# Patient Record
Sex: Female | Born: 1995
Health system: Southern US, Community
[De-identification: ages and names within clinical notes are randomized; demographics above are authoritative.]

## PROBLEM LIST (undated history)

## (undated) ENCOUNTER — Inpatient Hospital Stay: Payer: Self-pay

## (undated) DIAGNOSIS — S5330XA Traumatic rupture of unspecified ulnar collateral ligament, initial encounter: Secondary | ICD-10-CM

## (undated) HISTORY — PX: NO PAST SURGERIES: SHX2092

## (undated) HISTORY — DX: Traumatic rupture of unspecified ulnar collateral ligament, initial encounter: S53.30XA

## (undated) HISTORY — PX: EAR TUBE REMOVAL: SHX1486

## (undated) HISTORY — PX: WISDOM TOOTH EXTRACTION: SHX21

---
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
## 2017-01-17 DIAGNOSIS — Z0000 Encounter for general adult medical examination without abnormal findings: Secondary | ICD-10-CM | POA: Diagnosis not present

## 2017-01-30 ENCOUNTER — Encounter: Payer: Self-pay | Admitting: Obstetrics and Gynecology

## 2017-01-30 ENCOUNTER — Ambulatory Visit (INDEPENDENT_AMBULATORY_CARE_PROVIDER_SITE_OTHER): Payer: 59 | Admitting: Obstetrics and Gynecology

## 2017-01-30 VITALS — BP 120/70 | HR 99 | Ht 68.0 in | Wt 138.0 lb

## 2017-01-30 DIAGNOSIS — Z113 Encounter for screening for infections with a predominantly sexual mode of transmission: Secondary | ICD-10-CM | POA: Diagnosis not present

## 2017-01-30 DIAGNOSIS — Z30011 Encounter for initial prescription of contraceptive pills: Secondary | ICD-10-CM

## 2017-01-30 MED ORDER — NORETHIN-ETH ESTRAD-FE BIPHAS 1 MG-10 MCG / 10 MCG PO TABS: 1.0000 | ORAL_TABLET | Freq: Every day | ORAL | 3 refills | Status: DC

## 2017-01-30 NOTE — Progress Notes (Signed)
   Chief Complaint  Patient presents with  . Contraception    HPI:      [name] is a 21 y.o. G0P0000 who LMP was Patient's last menstrual period was 01/17/2017., presents today for [facility] consultation. Pt interested in OCPs. No hx of migraines with aura/HTN/tobacco use/clotting disorders. She has been sex active in the past, used condoms. No current sex activity.   No tob/alcohol/drug use.  Occas exercise.  No FH breast, ovar, colon cancer.    History reviewed. No pertinent past medical history.  Past Surgical History:  Procedure Laterality Date  . NO PAST SURGERIES      Family History  Problem Relation Age of Onset  . Cancer Mother 27       MELANOMA OF SKIN    Social History   Social History  . Marital status: Single    Spouse name: N/A  . Number of children: 0  . Years of education: 12   Occupational History  . STUDENT    Social History Main Topics  . Smoking status: Never Smoker  . Smokeless tobacco: Never Used  . Alcohol use No  . Drug use: No  . Sexual activity: No   Other Topics Concern  . Not on file   Social History Narrative  . No narrative on file     Current Outpatient Prescriptions:  .  Norethindrone-Ethinyl Estradiol-Fe Biphas (LO LOESTRIN FE) 1 MG-10 MCG / 10 MCG tablet, Take 1 tablet by mouth daily., Disp: 84 tablet, Rfl: 3   ROS:  Review of Systems  Constitutional: Negative for fever.  Gastrointestinal: Negative for blood in stool, constipation, diarrhea, nausea and vomiting.  Genitourinary: Negative for dyspareunia, dysuria, flank pain, frequency, hematuria, urgency, vaginal bleeding, vaginal discharge and vaginal pain.  Musculoskeletal: Negative for back pain.  Skin: Negative for rash.     OBJECTIVE:   Vitals:  BP 120/70   Pulse 99   Ht 5\' 8"  (1.727 m)   Wt 138 lb (62.6 kg)   LMP 01/17/2017   BMI 20.98 kg/m   Physical Exam  Constitutional: She is oriented to person, place, and time and well-developed,  well-nourished, and in no distress. Vital signs are normal.  Genitourinary: Vagina normal, uterus normal, cervix normal, right adnexa normal, left adnexa normal and vulva normal. Uterus is not enlarged. Cervix exhibits no motion tenderness and no tenderness. Right adnexum displays no mass and no tenderness. Left adnexum displays no mass and no tenderness. Vulva exhibits no erythema, no exudate, no lesion, no rash and no tenderness. Vagina exhibits no lesion.  Neurological: She is oriented to person, place, and time.  Vitals reviewed.    Assessment/Plan: Encounter for initial prescription of contraceptive pills - OCP Rx eRxd. 1 sample/coupon card given to pt. - Plan: Norethindrone-Ethinyl Estradiol-Fe Biphas (LO LOESTRIN FE) 1 MG-10 MCG / 10 MCG tablet  Screening for STD (sexually transmitted disease) - Plan: Chlamydia/Gonococcus/Trichomonas, NAA    Meds ordered this encounter  Medications  . Norethindrone-Ethinyl Estradiol-Fe Biphas (LO LOESTRIN FE) 1 MG-10 MCG / 10 MCG tablet    Sig: Take 1 tablet by mouth daily.    Dispense:  84 tablet    Refill:  3      Return in about 1 year (around 01/30/2018).  Carlos Quackenbush B. Jewell Haught, PA-C 01/30/2017 3:15 PM

## 2017-02-01 LAB — CHLAMYDIA/GONOCOCCUS/TRICHOMONAS, NAA
Chlamydia by NAA: NEGATIVE
Gonococcus by NAA: NEGATIVE
Trich vag by NAA: NEGATIVE

## 2017-06-03 ENCOUNTER — Telehealth: Payer: Self-pay

## 2017-06-03 NOTE — Telephone Encounter (Signed)
Pt called stating she never filled bc rx d/t it being $100/m.  Do we recommend a brand that is cheaper?  [phone]  Adv pt to call her ins co and get top three names of bcp they will cover and call us back.

## 2017-08-19 ENCOUNTER — Telehealth: Payer: Self-pay

## 2017-08-19 NOTE — Telephone Encounter (Signed)
Pt mom Nira Conn calling to report pt saw [facility] in August & was rx'd Lo Loestrin which was several hundred dollars/mo. She is inquiring if there is another OCP that would be cheaper. UV#[phone].

## 2017-12-01 ENCOUNTER — Emergency Department
Admission: EM | Admit: 2017-12-01 | Discharge: 2017-12-01 | Disposition: A | Discharge status: Home / Self Care | Payer: 59 | Attending: Emergency Medicine | Admitting: Emergency Medicine

## 2017-12-01 ENCOUNTER — Emergency Department: Payer: 59

## 2017-12-01 DIAGNOSIS — S59909A Unspecified injury of unspecified elbow, initial encounter: Secondary | ICD-10-CM

## 2017-12-01 DIAGNOSIS — Y9316 Activity, rowing, canoeing, kayaking, rafting and tubing: Secondary | ICD-10-CM | POA: Insufficient documentation

## 2017-12-01 DIAGNOSIS — Y9289 Other specified places as the place of occurrence of the external cause: Secondary | ICD-10-CM | POA: Diagnosis not present

## 2017-12-01 DIAGNOSIS — Y998 Other external cause status: Secondary | ICD-10-CM | POA: Diagnosis not present

## 2017-12-01 DIAGNOSIS — S59901A Unspecified injury of right elbow, initial encounter: Secondary | ICD-10-CM | POA: Insufficient documentation

## 2017-12-01 DIAGNOSIS — R600 Localized edema: Secondary | ICD-10-CM | POA: Diagnosis not present

## 2017-12-01 DIAGNOSIS — Z79899 Other long term (current) drug therapy: Secondary | ICD-10-CM | POA: Insufficient documentation

## 2017-12-01 NOTE — ED Provider Notes (Signed)
[facility] Emergency Department Provider Note  ____________________________________________   First MD Initiated Contact with Patient 12/01/17 2049     (approximate)  I have reviewed the triage vital signs and the nursing notes.   HISTORY  Chief Complaint Elbow Injury    HPI Roberta Gonzales is a 22 y.o. female was tubing behind a boat and fell off the tube.  She felt a large pop in the right elbow upon impact.  The elbow has become very swollen since impact.  She cannot extend the elbow due to pain.  She denies any numbness or tingling.  She denies any other injuries at this time.  History reviewed. No pertinent past medical history.  There are no active problems to display for this patient.   Past Surgical History:  Procedure Laterality Date  . EAR TUBE REMOVAL    . NO PAST SURGERIES    . WISDOM TOOTH EXTRACTION      Prior to Admission medications   Medication Sig Start Date End Date Taking? Authorizing Provider  Norethindrone-Ethinyl Estradiol-Fe Biphas (LO LOESTRIN FE) 1 MG-10 MCG / 10 MCG tablet Take 1 tablet by mouth daily. 0/62/69   Copland, Deirdre Evener, PA-C    Allergies Patient has no known allergies.  Family History  Problem Relation Age of Onset  . Cancer Mother 52       MELANOMA OF SKIN    Social History Social History   Tobacco Use  . Smoking status: Never Smoker  . Smokeless tobacco: Never Used  Substance Use Topics  . Alcohol use: No  . Drug use: No    Review of Systems  Constitutional: No fever/chills Eyes: No visual changes. ENT: No sore throat. Respiratory: Denies cough Genitourinary: Negative for dysuria. Musculoskeletal: Negative for back pain.  Positive for right elbow pain and swelling Skin: Negative for rash.    ____________________________________________   PHYSICAL EXAM:  VITAL SIGNS: ED Triage Vitals  Enc Vitals Group     BP 12/01/17 2031 120/65     Pulse Rate 12/01/17 2031 82     Resp  12/01/17 2031 18     Temp 12/01/17 2031 98.4 F (36.9 C)     Temp Source 12/01/17 2031 Oral     SpO2 12/01/17 2031 100 %     Weight 12/01/17 2031 130 lb (59 kg)     Height 12/01/17 2031 5\' 9"  (1.753 m)     Head Circumference --      Peak Flow --      Pain Score 12/01/17 2034 8     Pain Loc --      Pain Edu? --      Excl. in [place]? --     Constitutional: Alert and oriented. Well appearing and in no acute distress. Eyes: Conjunctivae are normal.  Head: Atraumatic. Nose: No congestion/rhinnorhea. Mouth/Throat: Mucous membranes are moist.   Cardiovascular: Normal rate, regular rhythm. Respiratory: Normal respiratory effort.  No retractions GU: deferred Musculoskeletal: Decreased range of motion of the right elbow.  Patient is unable to fully extend the elbow.  The elbow is very bruised and swollen.  The area is very tender to palpation.  Patient is able to grip my fingers with the right hand.  She is neurovascularly intact Neurologic:  Normal speech and language.  Skin:  Skin is warm, dry and intact. No rash noted. Psychiatric: Mood and affect are normal. Speech and behavior are normal.  ____________________________________________   LABS (all labs ordered are listed, but  only abnormal results are displayed)  Labs Reviewed - No data to display ____________________________________________   ____________________________________________  RADIOLOGY  X-ray of the right elbow is negative for any acute fracture, however there is a large amount of soft tissue swelling noted which could mask an occult fracture  ____________________________________________   PROCEDURES  Procedure(s) performed: Sling was applied by nursing staff  Procedures    ____________________________________________   INITIAL IMPRESSION / ASSESSMENT AND PLAN / ED COURSE  Pertinent labs & imaging results that were available during my care of the patient were reviewed by me and considered in my medical  decision making (see chart for details).  Patient is a 22 year old female presents emergency department complaining of right elbow pain after falling off the tube while tubing behind a boat.  She states she felt a large pop upon impact.  She states the elbow has continued to get more swollen since the impact.  On physical exam the right elbow is tender and swollen.  Patient is unable to fully extend the right elbow.  She is able to grip my fingers.  And is neurovascularly intact.  Remainder the exam is unremarkable  X-ray of the right elbow is negative for any acute fracture, however there is a large amount of soft tissue swelling which could mask an occult fracture.  X-ray results were explained to the patient and her father.  They are to call [facility] clinic orthopedics for a follow-up appointment.  They are both [place] clinic patients.  She was placed in a sling and given a ice pack.  They are to continue to apply ice throughout the next few days.  She is to try to extend the elbow very gently.  Return to emergency department if worsening.  Take over-the-counter ibuprofen for pain as needed.  She was discharged in stable condition in the care of her father.     As part of my medical decision making, I reviewed the following data within the [place] notes reviewed and incorporated, Old chart reviewed, Radiograph reviewed x-ray of the right elbow is negative for an acute fracture, Notes from prior ED visits and [place] Controlled Substance Database  ____________________________________________   FINAL CLINICAL IMPRESSION(S) / ED DIAGNOSES  Final diagnoses:  Elbow injury, initial encounter      NEW MEDICATIONS STARTED DURING THIS VISIT:  Discharge Medication List as of 12/01/2017  9:21 PM       Note:  This document was prepared using Dragon voice recognition software and may include unintentional dictation errors.    Versie Starks, PA-C 12/01/17 2142      Rudene Re, MD 12/01/17 2312

## 2017-12-01 NOTE — ED Notes (Signed)
Patient presents with right elbow injury. Right elbow swollen with slight deformity noted. States she believes she may have hyperextended it.

## 2017-12-01 NOTE — ED Triage Notes (Signed)
Patient was tubing, extended her right arm and felt her elbow pop. Patient visible swelling noted to elbow. Patient c/o pain to site.

## 2017-12-01 NOTE — Discharge Instructions (Addendum)
Follow-up with orthopedics, please call for an appointment.  Tell them you were seen in the emergency department and the x-ray was negative but there is a severe amount of swelling.  Sometimes this can mask an occult fracture or it could be a muscle/tendon tear.  Apply ice to the area as much as possible.  Wear the sling throughout the day.  May take it off to shower.  You need to take the arm out several times throughout the day and try to move it.  Return to the emergency department if your worsening

## 2017-12-01 NOTE — ED Notes (Signed)
Pt back from xr and placed in room 44. Pt has swelling right inner elbow after hyperflexion.

## 2017-12-03 ENCOUNTER — Other Ambulatory Visit: Payer: Self-pay | Admitting: Orthopedic Surgery

## 2017-12-03 DIAGNOSIS — M25521 Pain in right elbow: Secondary | ICD-10-CM

## 2017-12-09 DIAGNOSIS — L739 Follicular disorder, unspecified: Secondary | ICD-10-CM | POA: Diagnosis not present

## 2017-12-18 ENCOUNTER — Ambulatory Visit
Admission: RE | Admit: 2017-12-18 | Discharge: 2017-12-18 | Disposition: A | Discharge status: Home / Self Care | Payer: 59 | Source: Ambulatory Visit | Attending: Orthopedic Surgery | Admitting: Orthopedic Surgery

## 2017-12-18 DIAGNOSIS — S56811A Strain of other muscles, fascia and tendons at forearm level, right arm, initial encounter: Secondary | ICD-10-CM | POA: Insufficient documentation

## 2017-12-18 DIAGNOSIS — S52044A Nondisplaced fracture of coronoid process of right ulna, initial encounter for closed fracture: Secondary | ICD-10-CM | POA: Diagnosis not present

## 2017-12-18 DIAGNOSIS — M25421 Effusion, right elbow: Secondary | ICD-10-CM | POA: Diagnosis not present

## 2017-12-18 DIAGNOSIS — S56511A Strain of other extensor muscle, fascia and tendon at forearm level, right arm, initial encounter: Secondary | ICD-10-CM | POA: Diagnosis not present

## 2017-12-18 DIAGNOSIS — M25521 Pain in right elbow: Secondary | ICD-10-CM | POA: Diagnosis present

## 2017-12-18 DIAGNOSIS — S56211A Strain of other flexor muscle, fascia and tendon at forearm level, right arm, initial encounter: Secondary | ICD-10-CM | POA: Insufficient documentation

## 2017-12-18 DIAGNOSIS — S53441A Ulnar collateral ligament sprain of right elbow, initial encounter: Secondary | ICD-10-CM | POA: Insufficient documentation

## 2017-12-18 DIAGNOSIS — X58XXXA Exposure to other specified factors, initial encounter: Secondary | ICD-10-CM | POA: Diagnosis not present

## 2018-01-08 DIAGNOSIS — S5331XD Traumatic rupture of right ulnar collateral ligament, subsequent encounter: Secondary | ICD-10-CM | POA: Diagnosis not present

## 2018-01-20 DIAGNOSIS — S5330XA Traumatic rupture of unspecified ulnar collateral ligament, initial encounter: Secondary | ICD-10-CM | POA: Diagnosis not present

## 2018-01-20 DIAGNOSIS — M25521 Pain in right elbow: Secondary | ICD-10-CM | POA: Diagnosis not present

## 2018-01-20 DIAGNOSIS — M2560 Stiffness of unspecified joint, not elsewhere classified: Secondary | ICD-10-CM | POA: Diagnosis not present

## 2018-01-27 DIAGNOSIS — S5330XA Traumatic rupture of unspecified ulnar collateral ligament, initial encounter: Secondary | ICD-10-CM | POA: Diagnosis not present

## 2018-01-27 DIAGNOSIS — Z0000 Encounter for general adult medical examination without abnormal findings: Secondary | ICD-10-CM | POA: Diagnosis not present

## 2018-01-27 DIAGNOSIS — N926 Irregular menstruation, unspecified: Secondary | ICD-10-CM | POA: Diagnosis not present

## 2018-02-12 DIAGNOSIS — S5330XA Traumatic rupture of unspecified ulnar collateral ligament, initial encounter: Secondary | ICD-10-CM | POA: Diagnosis not present

## 2018-02-21 DIAGNOSIS — S5330XA Traumatic rupture of unspecified ulnar collateral ligament, initial encounter: Secondary | ICD-10-CM | POA: Diagnosis not present

## 2018-02-26 DIAGNOSIS — S5331XD Traumatic rupture of right ulnar collateral ligament, subsequent encounter: Secondary | ICD-10-CM | POA: Diagnosis not present

## 2018-02-26 DIAGNOSIS — S5330XA Traumatic rupture of unspecified ulnar collateral ligament, initial encounter: Secondary | ICD-10-CM | POA: Diagnosis not present

## 2018-03-31 DIAGNOSIS — Z23 Encounter for immunization: Secondary | ICD-10-CM | POA: Diagnosis not present

## 2018-03-31 DIAGNOSIS — Z111 Encounter for screening for respiratory tuberculosis: Secondary | ICD-10-CM | POA: Diagnosis not present

## 2018-04-21 DIAGNOSIS — Z808 Family history of malignant neoplasm of other organs or systems: Secondary | ICD-10-CM | POA: Diagnosis not present

## 2018-04-21 DIAGNOSIS — D225 Melanocytic nevi of trunk: Secondary | ICD-10-CM | POA: Diagnosis not present

## 2018-04-21 DIAGNOSIS — Z1283 Encounter for screening for malignant neoplasm of skin: Secondary | ICD-10-CM | POA: Diagnosis not present

## 2018-06-26 DIAGNOSIS — J0191 Acute recurrent sinusitis, unspecified: Secondary | ICD-10-CM | POA: Diagnosis not present

## 2018-10-20 ENCOUNTER — Ambulatory Visit: Payer: 59 | Admitting: Certified Nurse Midwife

## 2018-12-11 NOTE — Progress Notes (Signed)
Chief Complaint  Patient presents with  . Gynecologic Exam    HPI:      [name] is a 23 y.o. G0P0000, LMP 11/17/2018 (exact date)., presents today for her annual gynecological exam and pill refill.. She denies any significant gyn problems  Her menses are monthly and very light flow x 2-3 days. She denies dysmenorrhea. Since her last exam 01/30/2017, she had a right elbow injury that required PT for 2 months.  She is sexually active and uses OCPs for contraception. (Loestrin 1/20) No hx of STDs.  There is a family history of breast cancer in her MGM at age 93. Genetic testing has not been done.She does not do self breast exams. There is no family history of ovarian cancer   No tob/alcohol/drug use.  She exercises most days of the week by running or walking. She does get adequate calcium in her diet.   Past Medical History:  Diagnosis Date  . Traumatic rupture of ulnar collateral ligament of elbow     Past Surgical History:  Procedure Laterality Date  . EAR TUBE REMOVAL    . WISDOM TOOTH EXTRACTION      Family History  Problem Relation Age of Onset  . Cancer Mother 38       MELANOMA OF SKIN  . Breast cancer Maternal Grandmother 70    Social History   Socioeconomic History  . Marital status: Single    Spouse name: Not on file  . Number of children: 0  . Years of education: 41  . Highest education level: Not on file  Occupational History  . Occupation: STUDENT  Social Needs  . Financial resource strain: Not on file  . Food insecurity    Worry: Not on file    Inability: Not on file  . Transportation needs    Medical: Not on file    Non-medical: Not on file  Tobacco Use  . Smoking status: Never Smoker  . Smokeless tobacco: Never Used  Substance and Sexual Activity  . Alcohol use: No  . Drug use: No  . Sexual activity: Yes    Birth control/protection: Pill  Lifestyle  . Physical activity    Days per week: Not on file    Minutes per session:  Not on file  . Stress: Not on file  Relationships  . Social Herbalist on phone: Not on file    Gets together: Not on file    Attends religious service: Not on file    Active member of club or organization: Not on file    Attends meetings of clubs or organizations: Not on file    Relationship status: Not on file  . Intimate partner violence    Fear of current or ex partner: Not on file    Emotionally abused: Not on file    Physically abused: Not on file    Forced sexual activity: Not on file  Other Topics Concern  . Not on file  Social History Narrative  . Not on file     Current Outpatient Medications:  .  norethindrone-ethinyl estradiol (LOESTRIN) 1-20 MG-MCG tablet, Take 1 tablet by mouth daily., Disp: 3 Package, Rfl: 3   ROS:  Review of Systems  Constitutional: Negative for appetite change, fatigue and fever.  HENT: Negative for congestion, sinus pain and sore throat.   Eyes: Negative for redness and visual disturbance.  Respiratory: Negative for cough, shortness of breath and wheezing.   Cardiovascular: Negative  for chest pain and leg swelling.  Gastrointestinal: Negative for abdominal pain, constipation, diarrhea and nausea.  Endocrine: Negative for cold intolerance and heat intolerance.  Genitourinary: Negative for dysuria, genital sores, menstrual problem and vaginal discharge.  Musculoskeletal: Negative for arthralgias, joint swelling and myalgias.  Skin: Negative for rash.  Allergic/Immunologic: Negative for environmental allergies.  Neurological: Negative for dizziness, numbness and headaches.  Hematological: Does not bruise/bleed easily.  Psychiatric/Behavioral: Negative for dysphoric mood. The patient is not nervous/anxious.      OBJECTIVE:   Vitals:  BP 110/70   Ht 5\' 8"  (1.727 m)   Wt 144 lb 6.4 oz (65.5 kg)   LMP 11/17/2018 (Exact Date)   BMI 21.96 kg/m   Physical Exam  Constitutional: She is oriented to person, place, and time and  well-developed, well-nourished, and in no distress. Vital signs are normal.  Neck: Normal range of motion. Neck supple. No thyromegaly present.  Cardiovascular: Normal rate and regular rhythm.  No murmur heard. Pulmonary/Chest: Effort normal and breath sounds normal.  Abdominal: Soft. She exhibits no mass. There is no abdominal tenderness.  Genitourinary:    Vulva normal.  Uterus is not enlarged. Cervix exhibits no motion tenderness and no tenderness. Right adnexum displays no mass and no tenderness. Left adnexum displays no mass and no tenderness.    No vulval lesion, erythema, tenderness, rash or exudate noted.     No lesions in the vagina.     Genitourinary Comments: Vulva: no lesions or inflammation Vagina: no masses or bleeding Cervix: no lesions, closed Uterus: NSSC, AF, deviated to the left, mobile, NT. Adnexa: no masses, NT   Musculoskeletal: Normal range of motion.  Lymphadenopathy:    She has no cervical adenopathy.  Neurological: She is alert and oriented to person, place, and time.  Skin: Skin is warm and dry.  Psychiatric: Mood and affect normal.  Vitals reviewed.    Assessment/Plan: 23 year old WF for well woman exam Screening for STD-Aptima sent Surveillance for OCPS  Meds ordered this encounter  Medications  . norethindrone-ethinyl estradiol (LOESTRIN) 1-20 MG-MCG tablet    Sig: Take 1 tablet by mouth daily.    Dispense:  3 Package    Refill:  3    Follow up in 1 year and prn Taught SBE  Dalia Heading, CNM

## 2018-12-12 ENCOUNTER — Other Ambulatory Visit (HOSPITAL_COMMUNITY)
Admission: RE | Admit: 2018-12-12 | Discharge: 2018-12-12 | Disposition: A | Discharge status: Home / Self Care | Payer: 59 | Source: Ambulatory Visit | Attending: Certified Nurse Midwife | Admitting: Certified Nurse Midwife

## 2018-12-12 ENCOUNTER — Ambulatory Visit (INDEPENDENT_AMBULATORY_CARE_PROVIDER_SITE_OTHER): Payer: 59 | Admitting: Certified Nurse Midwife

## 2018-12-12 ENCOUNTER — Other Ambulatory Visit: Payer: Self-pay

## 2018-12-12 ENCOUNTER — Encounter: Payer: Self-pay | Admitting: Certified Nurse Midwife

## 2018-12-12 VITALS — BP 110/70 | Ht 68.0 in | Wt 144.4 lb

## 2018-12-12 DIAGNOSIS — Z01419 Encounter for gynecological examination (general) (routine) without abnormal findings: Secondary | ICD-10-CM | POA: Diagnosis not present

## 2018-12-12 DIAGNOSIS — Z113 Encounter for screening for infections with a predominantly sexual mode of transmission: Secondary | ICD-10-CM

## 2018-12-12 DIAGNOSIS — Z3041 Encounter for surveillance of contraceptive pills: Secondary | ICD-10-CM

## 2018-12-12 MED ORDER — NORETHINDRONE ACET-ETHINYL EST 1-20 MG-MCG PO TABS: 1.0000 | ORAL_TABLET | Freq: Every day | ORAL | 3 refills | Status: DC

## 2018-12-14 ENCOUNTER — Encounter: Payer: Self-pay | Admitting: Certified Nurse Midwife

## 2018-12-14 DIAGNOSIS — Z309 Encounter for contraceptive management, unspecified: Secondary | ICD-10-CM | POA: Insufficient documentation

## 2018-12-16 LAB — CERVICOVAGINAL ANCILLARY ONLY
Chlamydia: NEGATIVE
Neisseria Gonorrhea: NEGATIVE
Trichomonas: NEGATIVE

## 2019-10-28 ENCOUNTER — Ambulatory Visit: Payer: 59 | Attending: Internal Medicine

## 2019-10-28 ENCOUNTER — Ambulatory Visit: Payer: 59

## 2019-10-28 ENCOUNTER — Other Ambulatory Visit: Payer: Self-pay

## 2019-10-28 DIAGNOSIS — Z23 Encounter for immunization: Secondary | ICD-10-CM

## 2019-10-28 NOTE — Progress Notes (Signed)
   Covid-19 Vaccination Clinic  Name:  GYDA BRICKER    MRN: [id number] DOB: 1995/10/13  10/28/2019  Ms. Kio was observed post Covid-19 immunization for 15 minutes without incident. She was provided with Vaccine Information Sheet and instruction to access the V-Safe system.   Ms. Dario was instructed to call 911 with any severe reactions post vaccine: Marland Kitchen Difficulty breathing  . Swelling of face and throat  . A fast heartbeat  . A bad rash all over body  . Dizziness and weakness   Immunizations Administered    Name Date Dose VIS Date Route   Pfizer COVID-19 Vaccine 10/28/2019  8:33 AM 0.3 mL 08/12/2018 Intramuscular   Manufacturer: [place]   Lot: [id number]   [place]: [id number]

## 2019-11-24 ENCOUNTER — Ambulatory Visit: Payer: 59 | Attending: Internal Medicine

## 2019-11-24 DIAGNOSIS — Z23 Encounter for immunization: Secondary | ICD-10-CM

## 2019-11-24 NOTE — Progress Notes (Signed)
   Covid-19 Vaccination Clinic  Name:  ROSILYN COACHMAN    MRN: [id number] DOB: May 06, 1996  11/24/2019  Ms. Scioli was observed post Covid-19 immunization for 15 minutes without incident. She was provided with Vaccine Information Sheet and instruction to access the V-Safe system.   Ms. [place] was instructed to call 911 with any severe reactions post vaccine: Marland Kitchen Difficulty breathing  . Swelling of face and throat  . A fast heartbeat  . A bad rash all over body  . Dizziness and weakness   Immunizations Administered    Name Date Dose VIS Date Route   Pfizer COVID-19 Vaccine 11/24/2019  8:10 AM 0.3 mL 08/12/2018 Intramuscular   Manufacturer: [place]   Lot: [id number]   [place]: [id number]

## 2019-12-03 IMAGING — CR DG ELBOW COMPLETE 3+V*R*
1 series · 4 of 4 positions shown · non-contrast
Comparison: None.

CLINICAL DATA: Elbow injury with pain

EXAM:
RIGHT ELBOW - COMPLETE 3+ VIEW

[Series 1: dg elbow complete right (3+view) · 0.14mm/px · 4 of 4 slices shown]
[im 1/4]
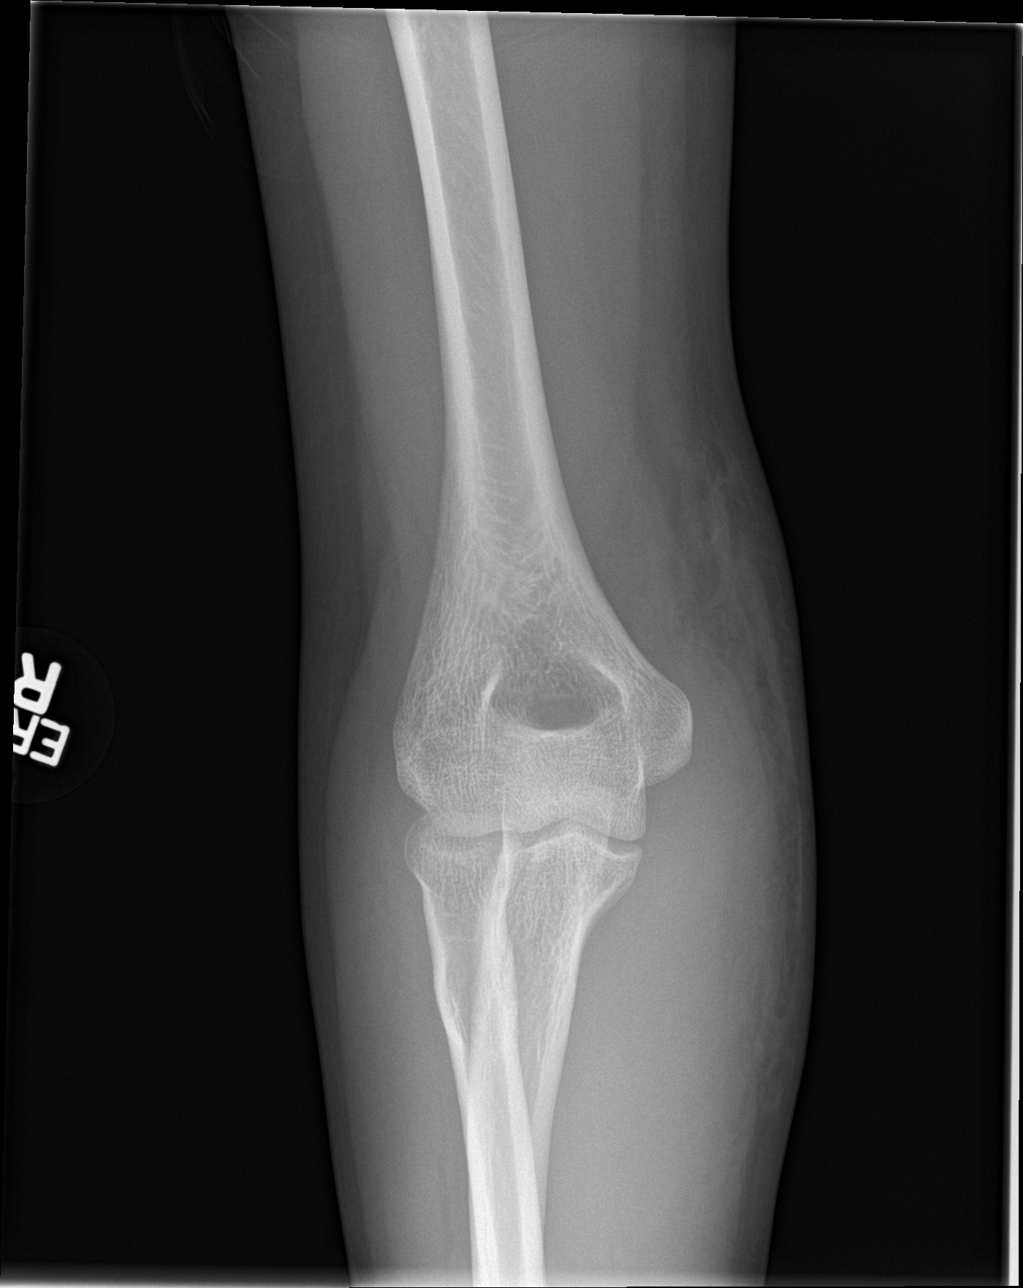
[im 2/4]
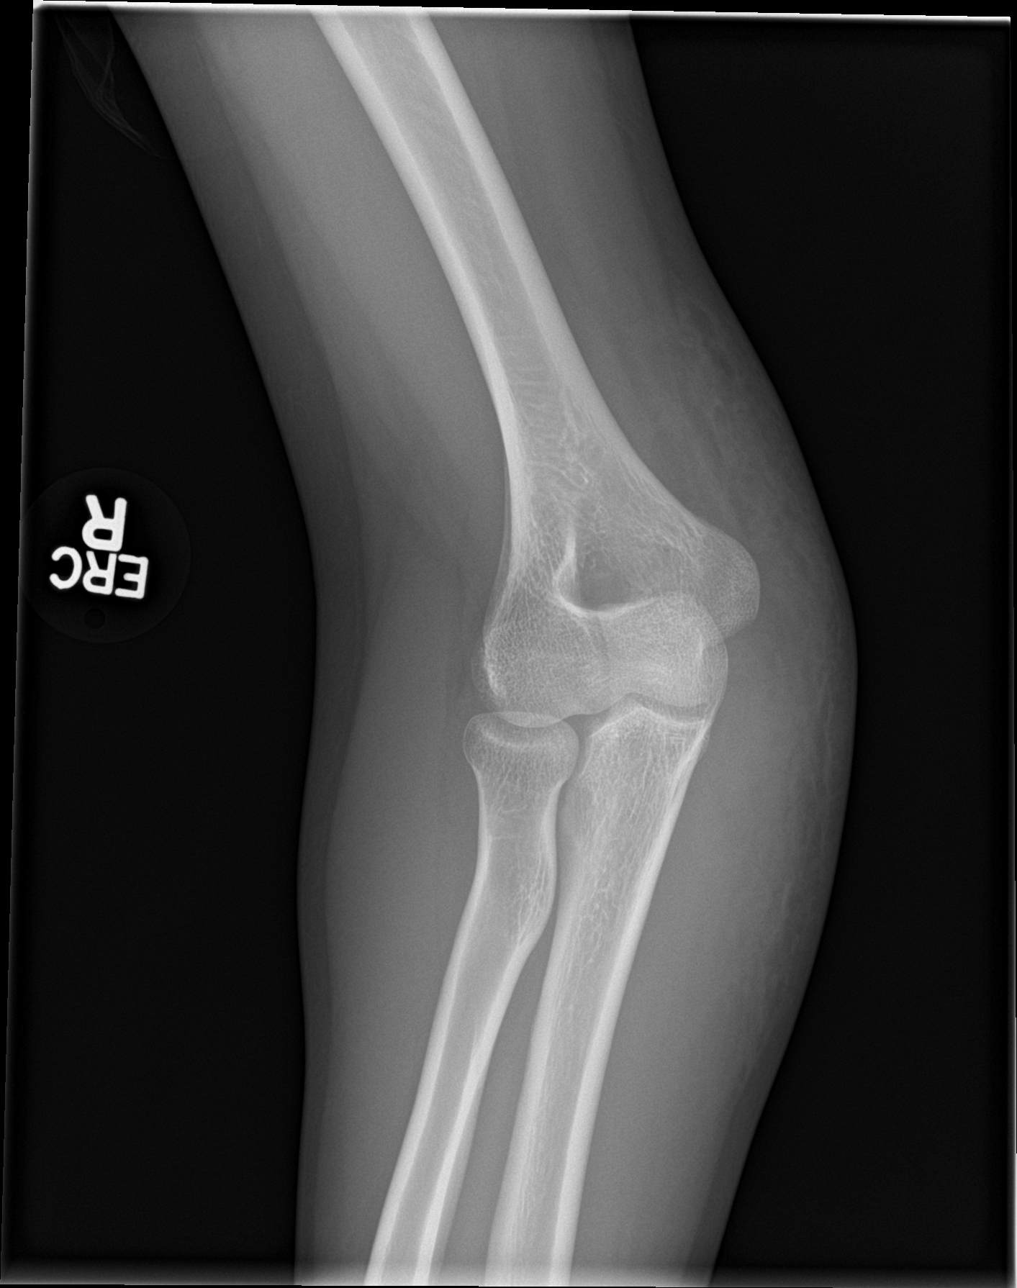
[im 3/4]
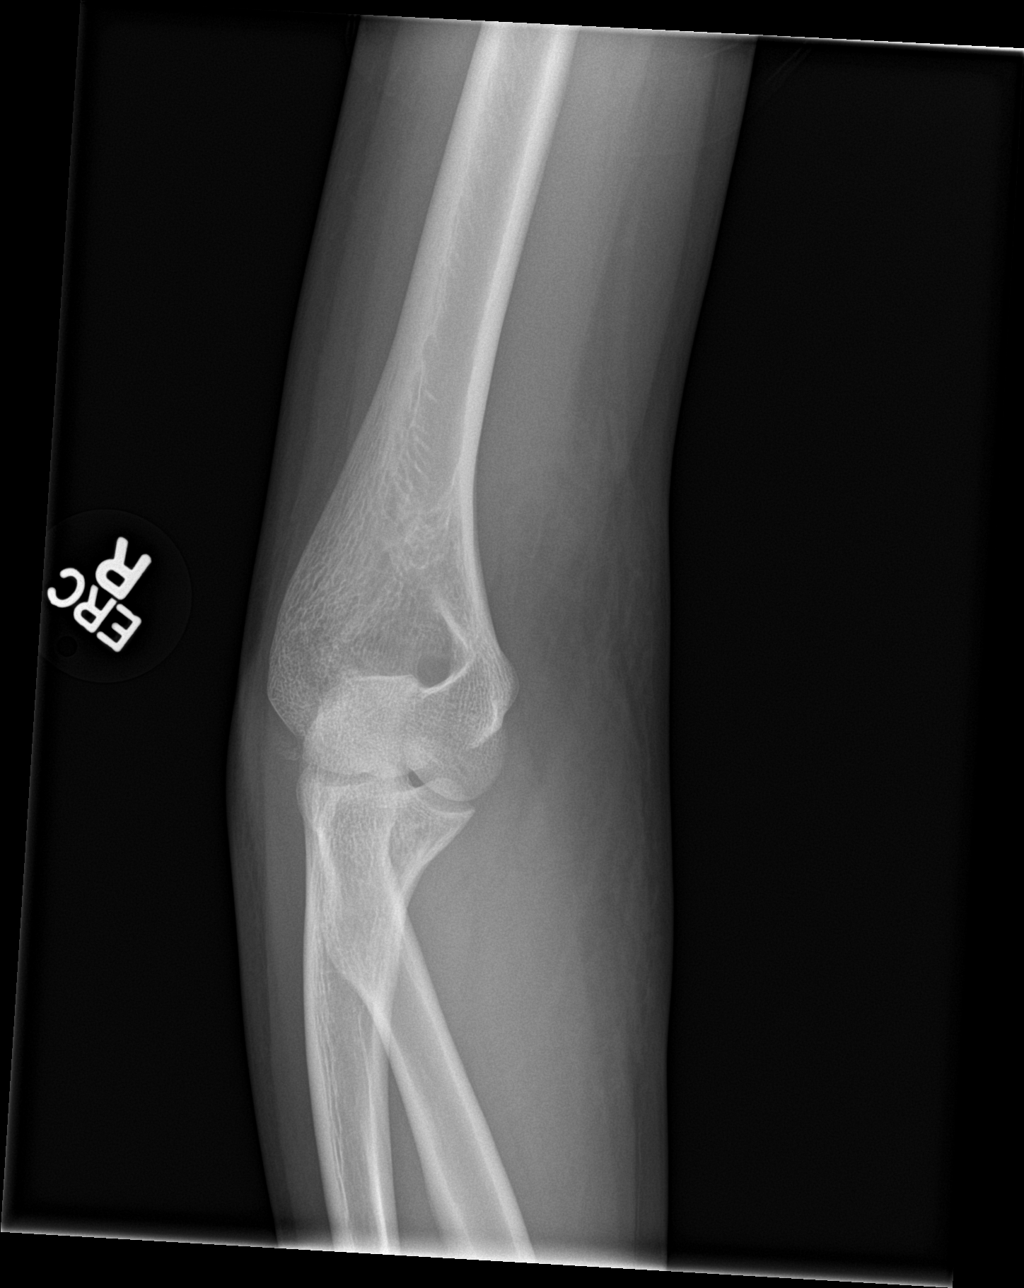
[im 4/4]
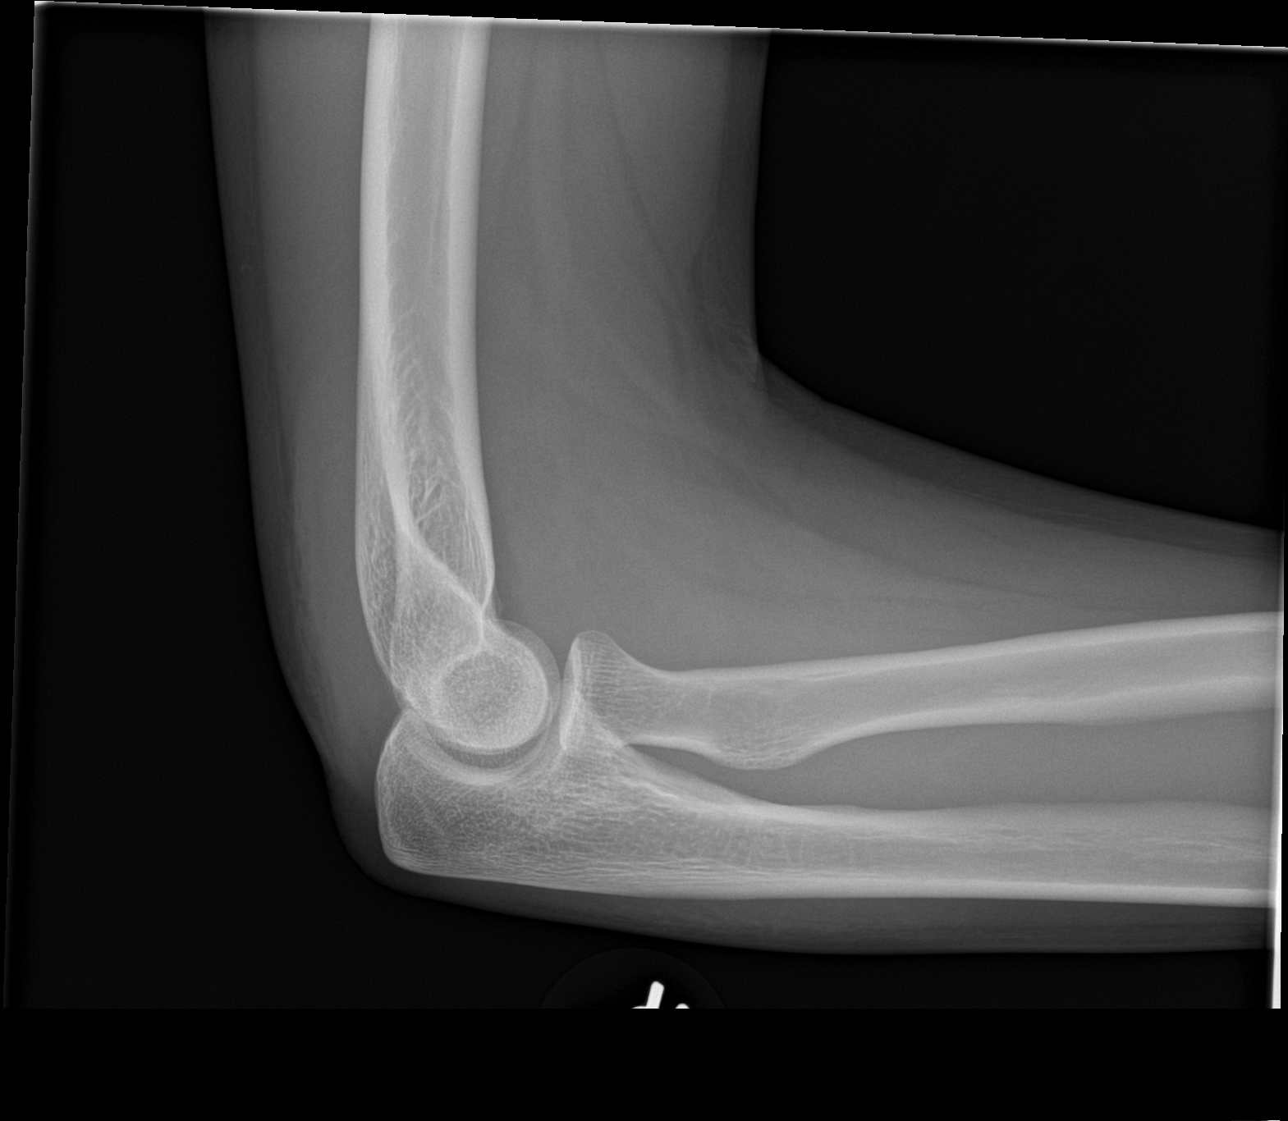

[4 of 4 positions shown; findings below may reference images not displayed]

FINDINGS: No fracture or malalignment. No significant elbow effusion. Moderate
soft tissue edema posterior and ulnar side of the elbow
IMPRESSION: Moderate soft tissue edema.  No definite acute osseous abnormality

## 2019-12-16 NOTE — Progress Notes (Addendum)
Chief Complaint  Patient presents with  . Gynecologic Exam    HPI:      [name] is a 24 y.o. G0P0000, LMP 12/10/2019 (exact date)., presents today for her annual gynecological exam and pill refill.. She denies any significant gyn problems  Her menses are usually monthly although sometimes she will not have any bleeding. Her flow is very light  x 2 days. She occasionally has dysmenorrhea, but does not require any medication. She has had an episode of BTB that was related to missing or being late taking pills. Since her last exam 12/12/2018, she has had no significant changes in her health. 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 She has completed her Pfizer vaccines for Covid  She is sexually active and uses OCPs for contraception. (Loestrin 1/20). She has had her Gardasil vaccines. No hx of STDs.  There is a family history of breast cancer in her MGM at age 21. Genetic testing has not been done.She does do self breast exams. There is no family history of ovarian cancer   No tob/alcohol/drug use.  She exercises 2-3 days a week by walking 30 minutes She does get adequate calcium in her diet.   Past Medical History:  Diagnosis Date  . Traumatic rupture of ulnar collateral ligament of elbow     Past Surgical History:  Procedure Laterality Date  . EAR TUBE REMOVAL    . WISDOM TOOTH EXTRACTION      Family History  Problem Relation Age of Onset  . Cancer Mother 88       MELANOMA OF SKIN  . Breast cancer Maternal Grandmother 70    Social History   Socioeconomic History  . Marital status: Single    Spouse name: Not on file  . Number of children: 0  . Years of education: 71  . Highest education level: Not on file  Occupational History  . 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 Smoking status: Never Smoker  . Smokeless tobacco: Never Used  Vaping Use  . Vaping Use: Never used  Substance  and Sexual Activity  . Alcohol use: No  . Drug use: No  . Sexual activity: Yes    Birth control/protection: Pill  Other Topics Concern  . Not on file  Social History Narrative  . Not on file   Social Determinants of Health   Financial Resource Strain:   . Difficulty of Paying Living Expenses:   Food Insecurity:   . Worried About Charity fundraiser in the Last Year:   . Arboriculturist in the Last Year:   Transportation Needs:   . Film/video editor (Medical):   Marland Kitchen Lack of Transportation (Non-Medical):   Physical Activity:   . Days of Exercise per Week:   . Minutes of Exercise per Session:   Stress:   . Feeling of Stress :   Social Connections:   . Frequency of Communication with Friends and Family:   . Frequency of Social Gatherings with Friends and Family:   . Attends Religious Services:   . Active Member of Clubs or Organizations:   . Attends Archivist Meetings:   Marland Kitchen Marital Status:   Intimate Partner Violence:   . Fear of Current or Ex-Partner:   . Emotionally Abused:   Marland Kitchen Physically Abused:   . Sexually Abused:      Current Outpatient Medications:  .  norethindrone-ethinyl estradiol (LOESTRIN) 1-20 MG-MCG tablet, Take 1 tablet by mouth daily., Disp: 3 Package, Rfl: 3   ROS:  Review of Systems  Constitutional: Negative for appetite change, fatigue and fever.  HENT: Negative for congestion, sinus pain and sore throat.   Eyes: Negative for redness and visual disturbance.  Respiratory: Negative for cough, shortness of breath and wheezing.   Cardiovascular: Negative for chest pain and leg swelling.  Gastrointestinal: Negative for abdominal pain, constipation, diarrhea and nausea.  Endocrine: Negative for cold intolerance and heat intolerance.  Genitourinary: Negative for dysuria, genital sores, menstrual problem and vaginal discharge.  Musculoskeletal: Negative for arthralgias, joint swelling and myalgias.  Skin: Negative for rash.    Allergic/Immunologic: Negative for environmental allergies.  Neurological: Negative for dizziness, numbness and headaches.  Hematological: Does not bruise/bleed easily.  Psychiatric/Behavioral: Negative for dysphoric mood. The patient is not nervous/anxious.      OBJECTIVE:   Vitals: BP 112/70   Pulse 89   Resp 16   Ht 5\' 8"  (1.727 m)   Wt 140 lb 9.6 oz (63.8 kg)   LMP 12/10/2019   SpO2 98%   BMI 21.38 kg/m   Physical Exam Vitals reviewed.  Neck:     Thyroid: No thyromegaly.  Cardiovascular:     Rate and Rhythm: Normal rate and regular rhythm.     Heart sounds: No murmur heard.   Pulmonary:     Effort: Pulmonary effort is normal.     Breath sounds: Normal breath sounds.  Chest:     Comments: Breasts: soft, NT, no masses, no nipple or skin changes. No nipple discharge.  Abdominal:     Palpations: Abdomen is soft. There is no mass.     Tenderness: There is no abdominal tenderness.  Genitourinary:    Comments: Vulva: no lesions or inflammation Vagina: no masses or bleeding Cervix: no lesions, closed, friable cx os with Pap smear Uterus: NSSC, AF, deviated to the left, mobile, NT. Adnexa: no masses, NT Musculoskeletal:        General: Normal range of motion.     Cervical back: Normal range of motion and neck supple.  Lymphadenopathy:     Cervical: No cervical adenopathy.     Upper Body:     Right upper body: No supraclavicular or axillary adenopathy.     Left upper body: No supraclavicular or axillary adenopathy.     Lower Body: No right inguinal adenopathy. No left inguinal adenopathy.  Skin:    General: Skin is warm and dry.  Neurological:     Mental Status: She is alert and oriented to person, place, and time.  Psychiatric:        Mood and Affect: Mood and affect normal.      Assessment/Plan: 24 year old WF for well woman exam Screening for STD/ cervical cancer-Paptima sent Surveillance for OCPS (BCPs ordered by PCP)    Follow up in 1 year for  annual  and prn  Dalia Heading, CNM

## 2019-12-17 ENCOUNTER — Other Ambulatory Visit (HOSPITAL_COMMUNITY)
Admission: RE | Admit: 2019-12-17 | Discharge: 2019-12-17 | Disposition: A | Discharge status: Home / Self Care | Payer: BC Managed Care – PPO | Source: Ambulatory Visit | Attending: Certified Nurse Midwife | Admitting: Certified Nurse Midwife

## 2019-12-17 ENCOUNTER — Encounter: Payer: Self-pay | Admitting: Certified Nurse Midwife

## 2019-12-17 ENCOUNTER — Ambulatory Visit (INDEPENDENT_AMBULATORY_CARE_PROVIDER_SITE_OTHER): Payer: BC Managed Care – PPO | Admitting: Certified Nurse Midwife

## 2019-12-17 ENCOUNTER — Other Ambulatory Visit: Payer: Self-pay

## 2019-12-17 VITALS — BP 112/70 | HR 89 | Resp 16 | Ht 68.0 in | Wt 140.6 lb

## 2019-12-17 DIAGNOSIS — Z01419 Encounter for gynecological examination (general) (routine) without abnormal findings: Secondary | ICD-10-CM

## 2019-12-17 DIAGNOSIS — Z3041 Encounter for surveillance of contraceptive pills: Secondary | ICD-10-CM

## 2019-12-17 DIAGNOSIS — Z113 Encounter for screening for infections with a predominantly sexual mode of transmission: Secondary | ICD-10-CM | POA: Diagnosis present

## 2019-12-17 DIAGNOSIS — Z124 Encounter for screening for malignant neoplasm of cervix: Secondary | ICD-10-CM | POA: Insufficient documentation

## 2019-12-18 LAB — CYTOLOGY - PAP
Chlamydia: NEGATIVE
Comment: NEGATIVE
Comment: NORMAL
Diagnosis: NEGATIVE
Neisseria Gonorrhea: NEGATIVE

## 2019-12-20 IMAGING — MR MR ELBOW*R* W/O CM
6 series · 36 of 40 positions shown · non-contrast
Comparison: Right elbow x-rays dated December 01, 2017.

CLINICAL DATA: Right elbow pain and swelling after injury while
tubing on [place] two weeks ago.

EXAM:
MRI OF THE RIGHT ELBOW WITHOUT CONTRAST
TECHNIQUE: Multiplanar, multisequence MR imaging of the elbow was performed. No
intravenous contrast was administered.

[Series 3: T1 · axial · 4.0mm · 0.44mm/px · z∈[-79,+70]mm · 8 of 35 slices shown (1 of 2)]
[im 1/35]
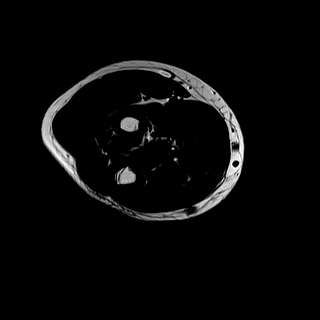
[im 5/35]
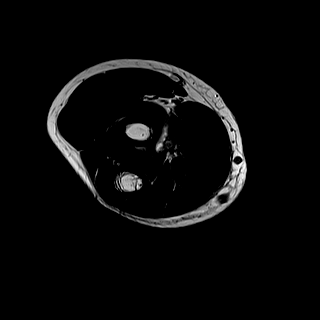
[im 10/35]
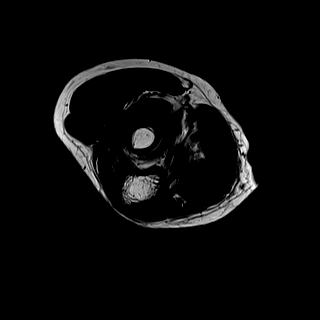
[im 15/35]
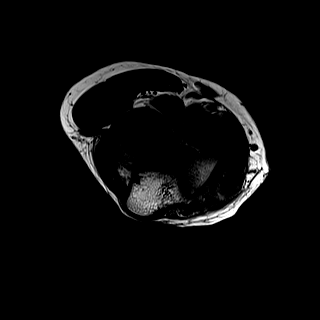
[im 20/35]
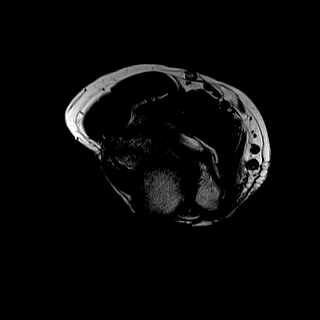
[im 25/35]
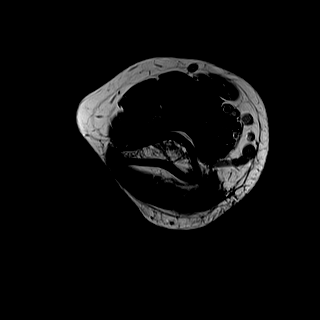
[im 30/35]
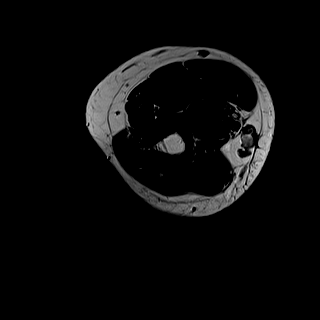
[im 35/35]
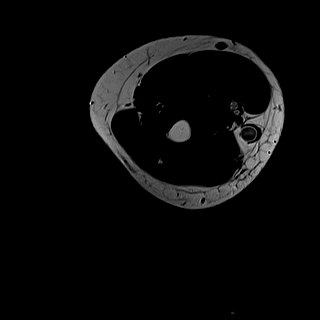

[Series 5: T2 fat-sat · axial · 4.0mm · 0.55mm/px · z∈[-79,+70]mm · 8 of 35 slices shown (1 of 2)]
[im 1/35]
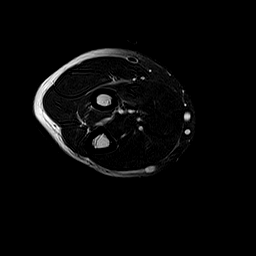
[im 5/35]
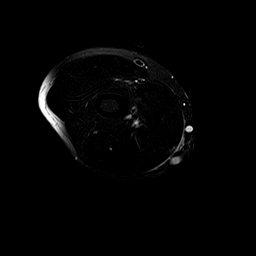
[im 10/35]
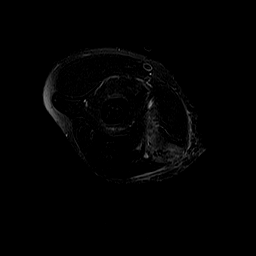
[im 15/35]
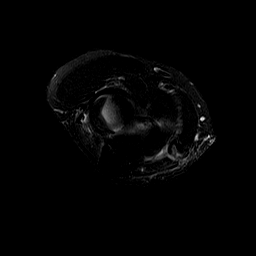
[im 20/35]
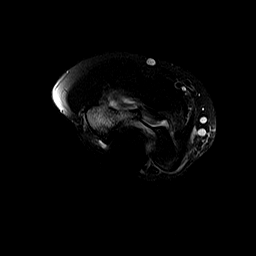
[im 25/35]
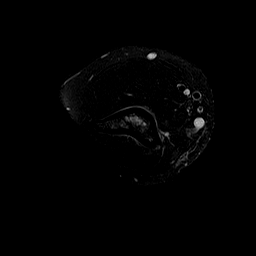
[im 30/35]
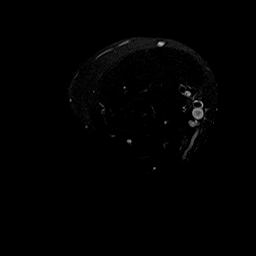
[im 35/35]
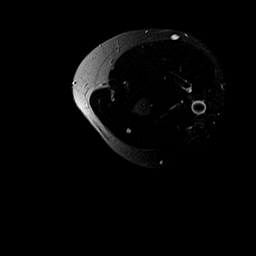

[Series 6: T2 fat-sat · coronal · 3.0mm · 0.55mm/px · 6 of 28 slices shown (2 of 2)]
[im 1/28]
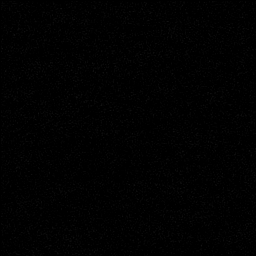
[im 6/28]
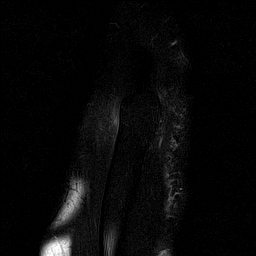
[im 11/28]
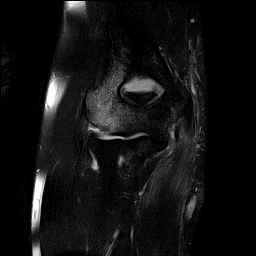
[im 17/28]
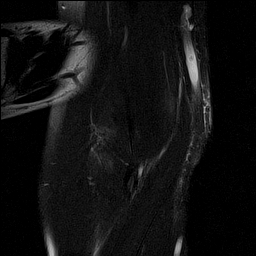
[im 22/28]
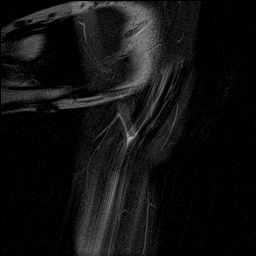
[im 28/28]
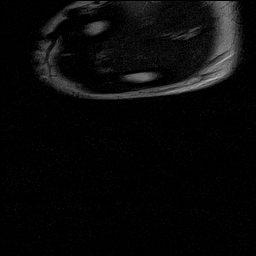

[Series 7: PD fat-sat · sagittal · 3.0mm · 0.31mm/px · 6 of 29 slices shown]
[im 1/29]
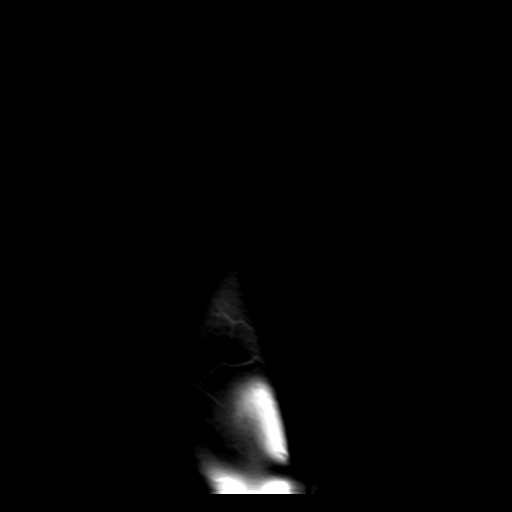
[im 6/29]
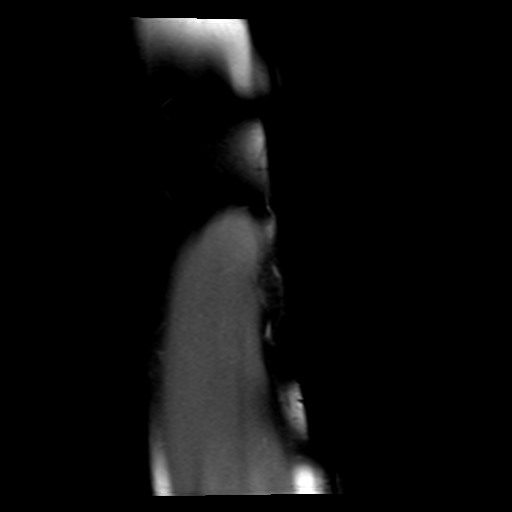
[im 12/29]
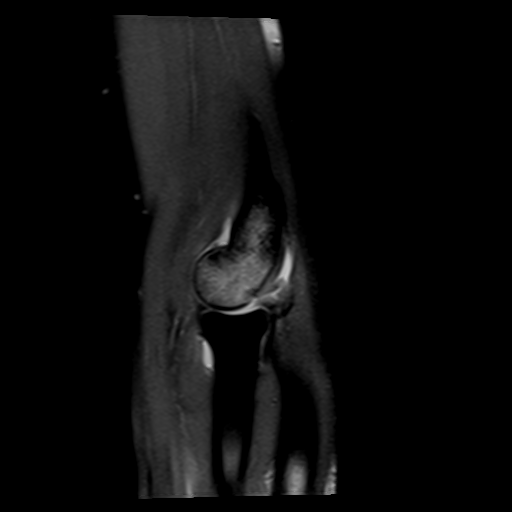
[im 17/29]
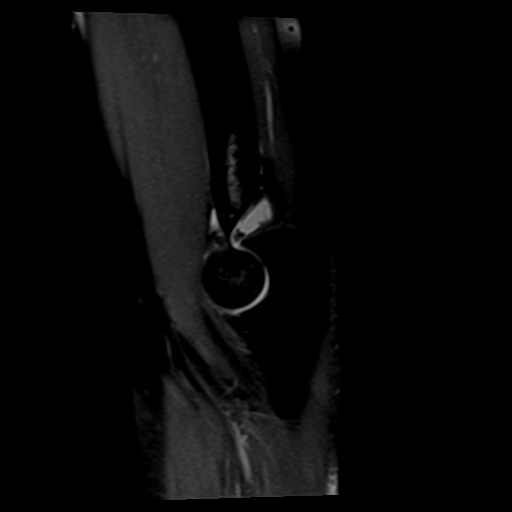
[im 23/29]
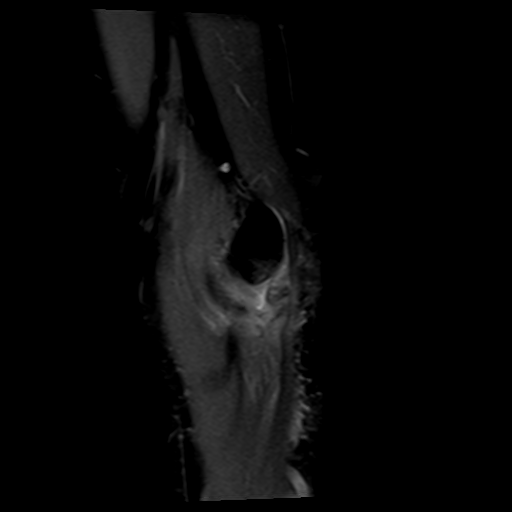
[im 29/29]
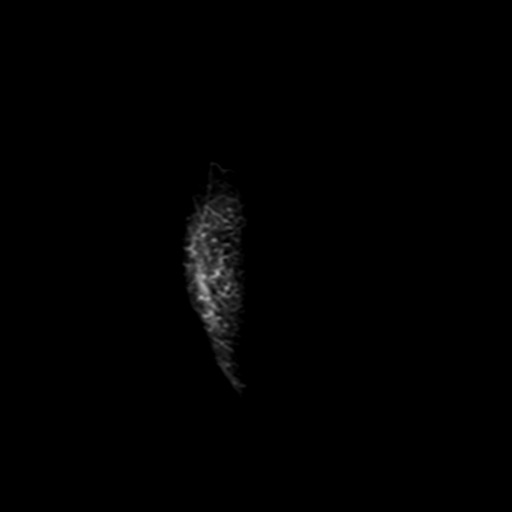

[Series 8: STIR · axial · 4.0mm · 0.31mm/px · z∈[-79,-31]mm · 3 of 35 slices shown]
[im 1/35]
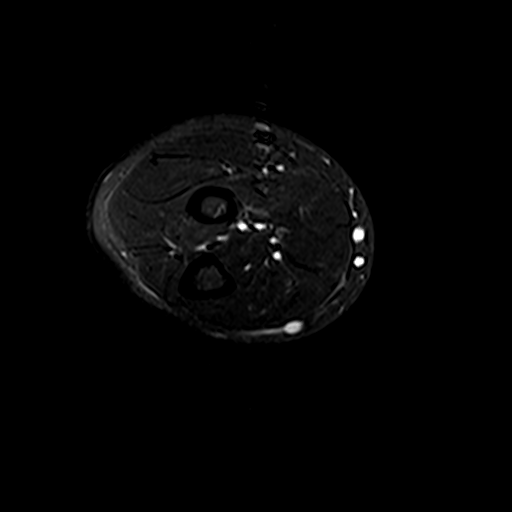
[im 6/35]
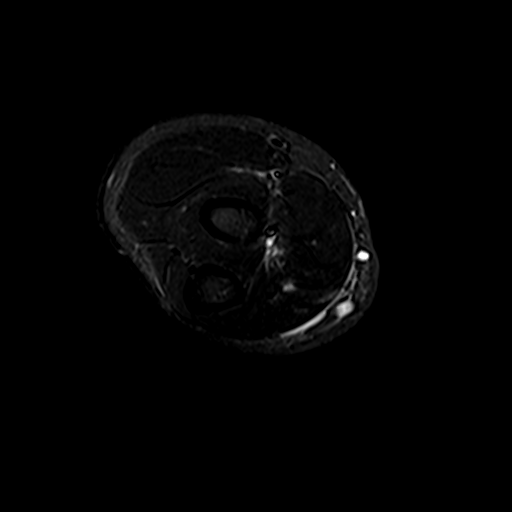
[im 12/35]
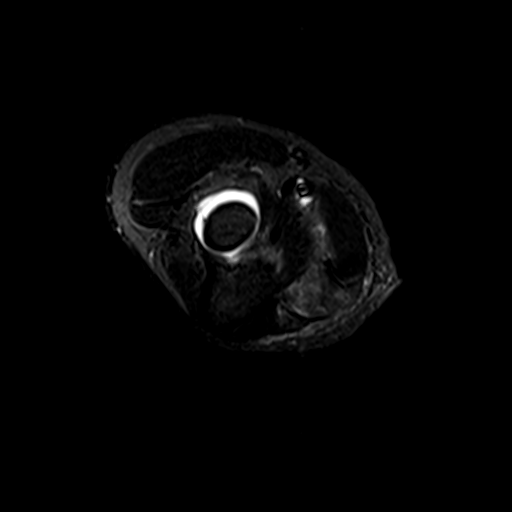

[Series 13: T1 · sagittal · 4.0mm · 0.44mm/px · 5 of 25 slices shown (2 of 2)]
[im 1/25]
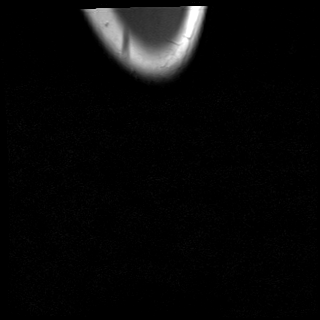
[im 7/25]
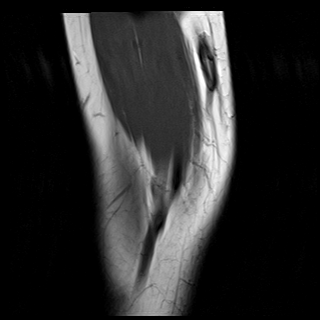
[im 13/25]
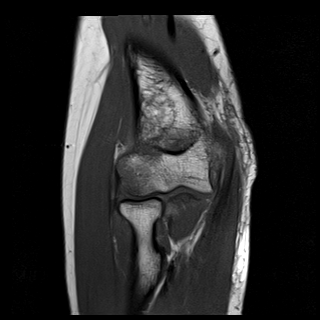
[im 19/25]
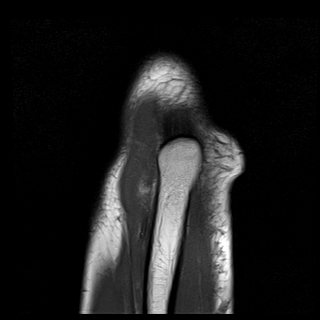
[im 25/25]
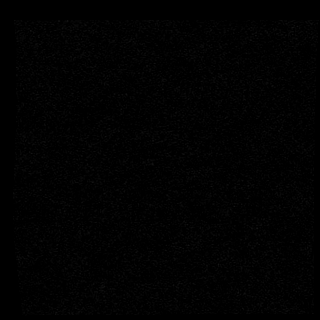

[36 of 40 positions shown; findings below may reference images not displayed]

FINDINGS: TENDONS

Common forearm flexor origin: Thickening and fluid signal within the
proximal common forearm flexor tendon, consistent with partial tear.
Mild edema within the proximal flexor carpi ulnaris muscle.

Common forearm extensor origin: Partial tear of the posterior aspect
of the proximal common forearm extensor tendon.

Biceps: Intact.

Triceps: Intact with normal signal.

LIGAMENTS

Medial stabilizers: Complete tear of the anterior band of the ulnar
collateral ligament from the sublime tubercle.

Lateral stabilizers: The lateral ulnar and radial collateral
ligaments are intact.

Cartilage: Preserved.  No focal chondral defect demonstrated.

Joint: Small joint effusion.  No loose body observed.

Cubital tunnel: Unremarkable.  The ulnar nerve appears normal.

Bones: Contusion and possible nondisplaced fracture of the lateral
epicondyle. Nondisplaced fracture of the coronoid process.

Other: None.
IMPRESSION: 1. Complete tear of the anterior band of the ulnar collateral
ligament from the sublime tubercle.
2. Partial tears of the common forearm flexor and extensor tendons.
Mild strain of the proximal flexor carpi ulnaris muscle.
3. Contusion and possible nondisplaced fracture of the lateral
epicondyle. Nondisplaced fracture of the coronoid process.
4. Small joint effusion.

## 2020-06-01 ENCOUNTER — Ambulatory Visit: Payer: 59 | Admitting: Dermatology

## 2020-06-06 ENCOUNTER — Ambulatory Visit (INDEPENDENT_AMBULATORY_CARE_PROVIDER_SITE_OTHER): Payer: BC Managed Care – PPO | Admitting: Dermatology

## 2020-06-06 ENCOUNTER — Other Ambulatory Visit: Payer: Self-pay

## 2020-06-06 DIAGNOSIS — D229 Melanocytic nevi, unspecified: Secondary | ICD-10-CM

## 2020-06-06 DIAGNOSIS — D225 Melanocytic nevi of trunk: Secondary | ICD-10-CM | POA: Diagnosis not present

## 2020-06-06 DIAGNOSIS — L700 Acne vulgaris: Secondary | ICD-10-CM | POA: Diagnosis not present

## 2020-06-06 DIAGNOSIS — I781 Nevus, non-neoplastic: Secondary | ICD-10-CM

## 2020-06-06 DIAGNOSIS — L814 Other melanin hyperpigmentation: Secondary | ICD-10-CM

## 2020-06-06 DIAGNOSIS — Z1283 Encounter for screening for malignant neoplasm of skin: Secondary | ICD-10-CM

## 2020-06-06 MED ORDER — DAPSONE 7.5 % EX GEL: 1.0000 "application " | Freq: Every day | CUTANEOUS | 11 refills | Status: DC

## 2020-06-06 NOTE — Progress Notes (Signed)
   New Patient Visit  Subjective  Roberta Gonzales is a 24 y.o. female who presents for the following: Annual Exam (No history of skin cancer or abnormal moles). The patient presents for Total-Body Skin Exam (TBSE) for skin cancer screening and mole check. + family history of abnormal skin growths.  The following portions of the chart were reviewed this encounter and updated as appropriate:   Tobacco  Allergies  Meds  Problems  Med Hx  Surg Hx  Fam Hx     Review of Systems:  No other skin or systemic complaints except as noted in HPI or Assessment and Plan.  Objective  Well appearing patient in no apparent distress; mood and affect are within normal limits.  A full examination was performed including scalp, head, eyes, ears, nose, lips, neck, chest, axillae, abdomen, back, buttocks, bilateral upper extremities, bilateral lower extremities, hands, feet, fingers, toes, fingernails, and toenails. All findings within normal limits unless otherwise noted below.  Objective  Left Shoulder - Anterior: Dilated blood vessel  Objective  left UQA: 0.5 cm slightly irregular brown macule  Objective  Head - Anterior (Face): Clear today   Assessment & Plan  Telangiectasia Left Shoulder - Anterior Benign appearing, observe.   Nevus left UQA Benign-appearing.  Observation.  Call clinic for new or changing lesions.  Recommend daily use of broad spectrum spf 30+ sunscreen to sun-exposed areas.    Acne vulgaris -chronic persistent Head - Anterior (Face) Continue Aczone 7.5% gel qhs May consider adding Winlevi if needed in the future  Dapsone (ACZONE) 7.5 % GEL - Head - Anterior (Face)   Melanocytic Nevi - Tan-brown and/or pink-flesh-colored symmetric macules and papules - Benign appearing on exam today - Observation - Call clinic for new or changing moles - Recommend daily use of broad spectrum spf 30+ sunscreen to sun-exposed areas.   Lentigines - Scattered tan macules -  Discussed due to sun exposure - Benign, observe - Recommend daily broad spectrum sunscreen SPF 30+ to sun-exposed areas, reapply every 2 hours as needed. - Call for any changes  Return in about 3 years (around 06/07/2023).  I, Ashok Cordia, CMA, am acting as scribe for Sarina Ser, MD .  Documentation: I have reviewed the above documentation for accuracy and completeness, and I agree with the above.  Sarina Ser, MD

## 2020-06-06 NOTE — Progress Notes (Deleted)
   Follow-Up Visit   Subjective  Roberta Gonzales is a 24 y.o. female who presents for the following: Annual Exam (No history of skin cancer or abnormal moles).  The patient presents for Total-Body Skin Exam (TBSE) for skin cancer screening and mole check.   The following portions of the chart were reviewed this encounter and updated as appropriate:       Review of Systems:  No other skin or systemic complaints except as noted in HPI or Assessment and Plan.  Objective  Well appearing patient in no apparent distress; mood and affect are within normal limits.  A full examination was performed including scalp, head, eyes, ears, nose, lips, neck, chest, axillae, abdomen, back, buttocks, bilateral upper extremities, bilateral lower extremities, hands, feet, fingers, toes, fingernails, and toenails. All findings within normal limits unless otherwise noted below.    Assessment & Plan   No follow-ups on file.  I, Ashok Cordia, CMA, am acting as scribe for Sarina Ser, MD .

## 2020-06-08 ENCOUNTER — Encounter: Payer: Self-pay | Admitting: Dermatology

## 2020-06-15 ENCOUNTER — Other Ambulatory Visit: Payer: Self-pay

## 2020-06-15 DIAGNOSIS — L700 Acne vulgaris: Secondary | ICD-10-CM

## 2020-06-15 MED ORDER — DAPSONE 5 % EX GEL: 1.0000 "application " | Freq: Every evening | CUTANEOUS | 11 refills | Status: DC

## 2020-06-15 NOTE — Progress Notes (Signed)
Dapsone 7.5% not covered. Sending Dapsone 5% to pharmacy.

## 2020-12-27 ENCOUNTER — Encounter: Payer: Self-pay | Admitting: Obstetrics

## 2020-12-27 ENCOUNTER — Other Ambulatory Visit (HOSPITAL_COMMUNITY)
Admission: RE | Admit: 2020-12-27 | Discharge: 2020-12-27 | Disposition: A | Discharge status: Home / Self Care | Payer: 59 | Source: Ambulatory Visit | Attending: Obstetrics | Admitting: Obstetrics

## 2020-12-27 ENCOUNTER — Ambulatory Visit (INDEPENDENT_AMBULATORY_CARE_PROVIDER_SITE_OTHER): Payer: 59 | Admitting: Obstetrics

## 2020-12-27 ENCOUNTER — Other Ambulatory Visit: Payer: Self-pay

## 2020-12-27 VITALS — BP 100/70 | Ht 68.0 in | Wt 149.0 lb

## 2020-12-27 DIAGNOSIS — Z01419 Encounter for gynecological examination (general) (routine) without abnormal findings: Secondary | ICD-10-CM | POA: Diagnosis not present

## 2020-12-27 DIAGNOSIS — Z124 Encounter for screening for malignant neoplasm of cervix: Secondary | ICD-10-CM | POA: Diagnosis present

## 2020-12-27 DIAGNOSIS — Z3009 Encounter for other general counseling and advice on contraception: Secondary | ICD-10-CM

## 2020-12-27 MED ORDER — NORETHINDRONE ACET-ETHINYL EST 1-20 MG-MCG PO TABS: 1.0000 | ORAL_TABLET | Freq: Every day | ORAL | 11 refills | Status: DC

## 2020-12-27 NOTE — Progress Notes (Signed)
Gynecology Annual Exam  PCP: Roberta Gonzales  Chief Complaint:  Chief Complaint  Patient presents with   Annual Exam    History of Present Illness:  [name] is a 25 y.o. G0P0000 who LMP was Patient's last menstrual period was 12/01/2020., presents today for her annual examination.  Her menses are regular every 28-30 days, lasting 3 day(s).  Dysmenorrhea none. She does not have intermenstrual bleeding.  She is single partner, contraception - OCP (estrogen/progesterone).  Last Pap: December 12, 2019  Results were: no abnormalities /neg HPV DNA  Hx of STDs: none  There is no FH of breast cancer. There is no FH of ovarian cancer. The patient does not do self-breast exams.  Tobacco use: The patient denies current or previous tobacco use. Alcohol use: none Exercise: not active    The patient wears seatbelts: yes.   The patient reports that domestic violence in her life is absent.   Past Medical History:  Diagnosis Date   Traumatic rupture of ulnar collateral ligament of elbow     Past Surgical History:  Procedure Laterality Date   EAR TUBE REMOVAL     WISDOM TOOTH EXTRACTION      Prior to Admission medications   Medication Sig Start Date End Date Taking? Authorizing Provider  norethindrone-ethinyl estradiol (LOESTRIN) 1-20 MG-MCG tablet Take 1 tablet by mouth daily. 12/12/18  Yes Dalia Heading, CNM  Dapsone 5 % topical gel Apply 1 application topically at bedtime. Patient not taking: Reported on 12/27/2020 06/15/20   Ralene Bathe, MD    No Known Allergies  Gynecologic History: Patient's last menstrual period was 12/01/2020. History of abnormal pap smear: No History of STI: No   Obstetric History: G0P0000  Social History   Socioeconomic History   Marital status: Single    Spouse name: Not on file   Number of children: 0   Years of education: 12   Highest education level: Not on file  Occupational History   Occupation: Soil scientist in  Press [employer]  Tobacco Use   Smoking status: Never   Smokeless tobacco: Never  Vaping Use   Vaping Use: Never used  Substance and Sexual Activity   Alcohol use: No   Drug use: No   Sexual activity: Yes    Birth control/protection: Pill  Other Topics Concern   Not on file  Social History Narrative   Not on file   Social Determinants of Health   Financial Resource Strain: Not on file  Food Insecurity: Not on file  Transportation Needs: Not on file  Physical Activity: Not on file  Stress: Not on file  Social Connections: Not on file  Intimate Partner Violence: Not on file    Family History  Problem Relation Age of Onset   Cancer Mother 33       MELANOMA OF SKIN   Breast cancer Maternal Grandmother 70    ROS   Physical Exam BP 100/70   Ht 5\' 8"  (1.727 m)   Wt 149 lb (67.6 kg)   LMP 12/01/2020   BMI 22.66 kg/m    OBGyn Exam  Female chaperone present for pelvic and breast  portions of the physical exam  Result    Assessment: 25 y.o. G0P0000 female here for routine annual gynecologic examination  Plan: Problem List Items Addressed This Visit   None Visit Diagnoses     Women's annual routine gynecological examination    -  Primary   Cervical cancer screening  Screening: -- Blood pressure screen normal -- Weight screening: normal -- Depression screening negative (PHQ-9) -- Nutrition: normal -- cholesterol screening: not due for screening -- osteoporosis screening: not due -- tobacco screening: not using -- alcohol screening: AUDIT questionnaire indicates low-risk usage. -- family history of breast cancer screening: done. not at high risk. -- no evidence of domestic violence or intimate partner violence. -- STD screening: gonorrhea/chlamydia NAAT not collected per patient request. -- pap smear collected per ASCCP guidelines -- flu vaccine  not done -- HPV vaccination series: received OCPs  Rx renewed.  Imagene Riches, CNM  12/27/2020  9:12 AM   12/27/2020 9:05 AM

## 2021-01-02 LAB — CYTOLOGY - PAP: Diagnosis: NEGATIVE

## 2021-01-13 ENCOUNTER — Encounter: Payer: Self-pay | Admitting: Obstetrics

## 2021-10-11 ENCOUNTER — Encounter: Payer: Self-pay | Admitting: Dermatology

## 2021-10-11 ENCOUNTER — Telehealth: Payer: Self-pay

## 2021-10-11 ENCOUNTER — Ambulatory Visit (INDEPENDENT_AMBULATORY_CARE_PROVIDER_SITE_OTHER): Payer: 59 | Admitting: Dermatology

## 2021-10-11 DIAGNOSIS — L700 Acne vulgaris: Secondary | ICD-10-CM

## 2021-10-11 MED ORDER — SPIRONOLACTONE 25 MG PO TABS: ORAL_TABLET | ORAL | 2 refills | Status: DC

## 2021-10-11 MED ORDER — DOXYCYCLINE HYCLATE 100 MG PO CAPS: 100.0000 mg | ORAL_CAPSULE | Freq: Every day | ORAL | 2 refills | Status: DC

## 2021-10-11 NOTE — Progress Notes (Signed)
? ?  Follow-Up Visit ?  ?Subjective  ?Roberta Gonzales is a 26 y.o. female who presents for the following: Acne (Face. Flared since stopped OCP (6 months). Resumed Aczone and Adapalene at end of last summer due to flare. Restarted OCP in January and acne has started to improve. Concerned with scarring). ? ?The following portions of the chart were reviewed this encounter and updated as appropriate:  Tobacco  Allergies  Meds  Problems  Med Hx  Surg Hx  Fam Hx   ?  ?Review of Systems: No other skin or systemic complaints except as noted in HPI or Assessment and Plan. ? ?Objective  ?Well appearing patient in no apparent distress; mood and affect are within normal limits. ? ?A focused examination was performed including face, neck. Relevant physical exam findings are noted in the Assessment and Plan. ? ?face ?Scattered comedones, inflammatory papules, cystic papules   ? ? ?Assessment & Plan  ?Acne vulgaris ?face ?Chronic and persistent condition with duration or expected duration over one year. Condition is symptomatic / bothersome to patient. Not to goal. ? ?Start Doxycycline 100 mg 1 capsule with dinner. Do not take while in [place].  ?Start Spironolactone 25 mg every morning.  ?Continue Adapalene 0.3% gel at bedtime. Patient will call for refills. ? ?BP: 114/80 ?Pulse: 88  ?Wt: 149 lbs ? ?doxycycline (VIBRAMYCIN) 100 MG capsule - face ?Take 1 capsule (100 mg total) by mouth daily. Take with dinner ?spironolactone (ALDACTONE) 25 MG tablet - face ?Take 1 tablet every morning ?Related Medications ?Dapsone 5 % topical gel ?Apply 1 application topically at bedtime. ? ?Return in about 3 months (around 01/10/2022) for Acne Follow Up. ? ?I, Emelia Salisbury, CMA, am acting as scribe for Sarina Ser, MD. ?Documentation: I have reviewed the above documentation for accuracy and completeness, and I agree with the above. ? ?Sarina Ser, MD ? ? ?

## 2021-10-11 NOTE — Telephone Encounter (Signed)
Patient called and left VM that she does not want to take Doxycycline at this time due to the side effect of being sensitive to the sun.  ?Patient will start Spironolactone.  ?

## 2021-10-11 NOTE — Patient Instructions (Signed)
Start Doxycycline 100 mg 1 capsule with dinner. Do not take while in [place].  ? ?Start Spironolactone 25 mg every morning.  ? ?Continue Adapalene 0.3% gel at bedtime. Patient will call for refills. ? ? ?Doxycycline should be taken with food to prevent nausea. Do not lay down for 30 minutes after taking. Be cautious with sun exposure and use good sun protection while on this medication. Pregnant women should not take this medication.   ? ?Spironolactone can cause increased urination and cause blood pressure to decrease. Please watch for signs of lightheadedness and be cautious when changing position. It can sometimes cause breast tenderness or an irregular period in premenopausal women. It can also increase potassium. The increase in potassium usually is not a concern unless you are taking other medicines that also increase potassium, so please be sure your doctor knows all of the other medications you are taking. This medication should not be taken by pregnant women.  This medicine should also not be taken together with sulfa drugs like Bactrim (trimethoprim/sulfamethexazole).   ? ?Topical retinoid medications like tretinoin/Retin-A, adapalene/Differin, tazarotene/Fabior, and Epiduo/Epiduo Forte can cause dryness and irritation when first started. Only apply a pea-sized amount to the entire affected area. Avoid applying it around the eyes, edges of mouth and creases at the nose. If you experience irritation, use a good moisturizer first and/or apply the medicine less often. If you are doing well with the medicine, you can increase how often you use it until you are applying every night. Be careful with sun protection while using this medication as it can make you sensitive to the sun. This medicine should not be used by pregnant women.   ? ?If You Need Anything After Your Visit ? ?If you have any questions or concerns for your doctor, please call our main line at [phone] and press option 4 to reach your doctor's  medical assistant. If no one answers, please leave a voicemail as directed and we will return your call as soon as possible. Messages left after 4 pm will be answered the following business day.  ? ?You may also send [place] a message via MyChart. We typically respond to MyChart messages within 1-2 business days. ? ?For prescription refills, please ask your pharmacy to contact our office. Our fax number is [phone]. ? ?If you have an urgent issue when the clinic is closed that cannot wait until the next business day, you can page your doctor at the number below.   ? ?Please note that while we do our best to be available for urgent issues outside of office hours, we are not available 24/7.  ? ?If you have an urgent issue and are unable to reach [place], you may choose to seek medical care at your doctor's office, retail clinic, urgent care center, or emergency room. ? ?If you have a medical emergency, please immediately call 911 or go to the emergency department. ? ?Pager Numbers ? ?- Dr. Nehemiah Massed: [phone] ? ?- Dr. Laurence Ferrari: [phone] ? ?- Dr. Nicole Kindred: [phone] ? ?In the event of inclement weather, please call our main line at [phone] for an update on the status of any delays or closures. ? ?Dermatology Medication Tips: ?Please keep the boxes that topical medications come in in order to help keep track of the instructions about where and how to use these. Pharmacies typically print the medication instructions only on the boxes and not directly on the medication tubes.  ? ?If your medication is too expensive, please contact  our office at [phone] option 4 or send [place] a message through [place].  ? ?We are unable to tell what your co-pay for medications will be in advance as this is different depending on your insurance coverage. However, we may be able to find a substitute medication at lower cost or fill out paperwork to get insurance to cover a needed medication.  ? ?If a prior authorization is required to  get your medication covered by your insurance company, please allow [place] 1-2 business days to complete this process. ? ?Drug prices often vary depending on where the prescription is filled and some pharmacies may offer cheaper prices. ? ?The website www.goodrx.com contains coupons for medications through different pharmacies. The prices here do not account for what the cost may be with help from insurance (it may be cheaper with your insurance), but the website can give you the price if you did not use any insurance.  ?- You can print the associated coupon and take it with your prescription to the pharmacy.  ?- You may also stop by our office during regular business hours and pick up a GoodRx coupon card.  ?- If you need your prescription sent electronically to a different pharmacy, notify our office through [facility] or by phone at [phone] option 4. ? ? ? ? ?Si Usted Necesita Algo Despu?s de Su Visita ? ?Tambi?n puede enviarnos un mensaje a trav?s de MyChart. Por lo general respondemos a los mensajes de MyChart en el transcurso de 1 a 2 d?as h?biles. ? ?Para renovar recetas, por favor pida a su farmacia que se ponga en contacto con nuestra oficina. Nuestro n?mero de fax es el [phone]. ? ?Si tiene un asunto urgente cuando la cl?nica est? cerrada y que no puede esperar hasta el siguiente d?a h?bil, puede llamar/localizar a su doctor(a) al n?mero que aparece a continuaci?n.  ? ?Por favor, tenga en cuenta que aunque hacemos todo lo posible para estar disponibles para asuntos urgentes fuera del horario de oficina, no estamos disponibles las 24 horas del d?a, los 7 d?as de la semana.  ? ?Si tiene un problema urgente y no puede comunicarse con nosotros, puede optar por buscar atenci?n m?dica  en el consultorio de su doctor(a), en una cl?nica privada, en un centro de atenci?n urgente o en una sala de emergencias. ? ?Si tiene Engineer, maintenance (IT) m?dica, por favor llame inmediatamente al 911 o vaya a la sala de  emergencias. ? ?N?meros de b?per ? ?- Dr. Nehemiah Massed: [phone] ? ?- Dra. Moye: [phone] ? ?- Dra. Nicole Kindred: [phone] ? ?En caso de inclemencias del tiempo, por favor llame a nuestra l?nea principal al [phone] para una actualizaci?n sobre el estado de cualquier retraso o cierre. ? ?Consejos para la medicaci?n en dermatolog?a: ?Por favor, guarde las cajas en las que vienen los medicamentos de uso t?pico para ayudarle a seguir las instrucciones sobre d?nde y c?mo usarlos. Las farmacias generalmente imprimen las instrucciones del medicamento s?lo en las cajas y no directamente en los tubos del [place].  ? ?Si su medicamento es muy caro, por favor, p?ngase en contacto con Zigmund Daniel llamando al [phone] y presione la opci?n 4 o env?enos un mensaje a trav?s de MyChart.  ? ?No podemos decirle cu?l ser? su copago por los medicamentos por adelantado ya que esto es diferente dependiendo de la cobertura de su seguro. Sin embargo, es posible que podamos encontrar un medicamento sustituto a Electrical engineer un formulario para que el seguro cubra el medicamento  que se considera necesario.  ? ?Si se requiere Ardelia Mems autorizaci?n previa para que su compa??a de seguros [place] su medicamento, por favor perm?tanos de 1 a 2 d?as h?biles para completar este proceso. ? ?Los precios de los medicamentos var?an con frecuencia dependiendo del Environmental consultant de d?nde se surte la receta y alguna farmacias pueden ofrecer precios m?s baratos. ? ?El sitio web www.goodrx.com tiene cupones para medicamentos de Airline pilot. Los precios aqu? no tienen en cuenta lo que podr?a costar con la ayuda del seguro (puede ser m?s barato con su seguro), pero el sitio web puede darle el precio si no utiliz? ning?n seguro.  ?- Puede imprimir el cup?n correspondiente y llevarlo con su receta a la farmacia.  ?- Tambi?n puede pasar por nuestra oficina durante el horario de atenci?n regular y recoger una tarjeta de cupones de GoodRx.  ?- Si  necesita que su receta se env?e electr?nicamente a Chiropodist, informe a nuestra oficina a trav?s de MyChart de [place] o por tel?fono llamando al [phone] y presione la opci?n 4.

## 2021-10-12 NOTE — Telephone Encounter (Signed)
Left detailed voicemail with information per Dr. Nehemiah Massed. aw ?

## 2021-10-22 ENCOUNTER — Encounter: Payer: Self-pay | Admitting: Dermatology

## 2021-12-28 ENCOUNTER — Other Ambulatory Visit (HOSPITAL_COMMUNITY)
Admission: RE | Admit: 2021-12-28 | Discharge: 2021-12-28 | Disposition: A | Discharge status: Home / Self Care | Payer: 59 | Source: Ambulatory Visit | Attending: Obstetrics | Admitting: Obstetrics

## 2021-12-28 ENCOUNTER — Ambulatory Visit (INDEPENDENT_AMBULATORY_CARE_PROVIDER_SITE_OTHER): Payer: 59 | Admitting: Obstetrics

## 2021-12-28 VITALS — BP 122/70 | Ht 69.0 in | Wt 150.0 lb

## 2021-12-28 DIAGNOSIS — Z3009 Encounter for other general counseling and advice on contraception: Secondary | ICD-10-CM

## 2021-12-28 DIAGNOSIS — Z124 Encounter for screening for malignant neoplasm of cervix: Secondary | ICD-10-CM | POA: Insufficient documentation

## 2021-12-28 DIAGNOSIS — Z01419 Encounter for gynecological examination (general) (routine) without abnormal findings: Secondary | ICD-10-CM

## 2021-12-28 MED ORDER — NORETHINDRONE ACET-ETHINYL EST 1-20 MG-MCG PO TABS: 1.0000 | ORAL_TABLET | Freq: Every day | ORAL | 4 refills | Status: DC

## 2021-12-28 NOTE — Progress Notes (Signed)
Gynecology Annual Exam  PCP: Ellene Route  Chief Complaint:  Chief Complaint  Patient presents with   Annual Exam    History of Present Illness:  [name] is a 26 y.o. G0P0000 who LMP was Patient's last menstrual period was 12/07/2021 (exact date)., presents today for her annual examination. 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 She is partnered, and they have bought a house. Her menses are regular every 28-30 days, lasting 5 day(s).  Dysmenorrhea none. She does not have intermenstrual bleeding. She has been seen by [facility] recently and was started on some oral meds for acne.  She is single partner, contraception - OCP (estrogen/progesterone).  Last Pap: December 22, 2020  Results were: no abnormalities /neg HPV DNA NA Hx of STDs: none  There is no FH of breast cancer. There is no FH of ovarian cancer. The patient does not do self-breast exams.  Tobacco use: The patient denies current or previous tobacco use. Alcohol use: social drinker Exercise: moderately active    The patient wears seatbelts: yes.   The patient reports that domestic violence in her life is absent.   Past Medical History:  Diagnosis Date   Traumatic rupture of ulnar collateral ligament of elbow     Past Surgical History:  Procedure Laterality Date   EAR TUBE REMOVAL     WISDOM TOOTH EXTRACTION      Prior to Admission medications   Medication Sig Start Date End Date Taking? Authorizing Provider  spironolactone (ALDACTONE) 25 MG tablet Take 1 tablet every morning 10/11/21  Yes Ralene Bathe, MD  norethindrone-ethinyl estradiol (LOESTRIN) 1-20 MG-MCG tablet Take 1 tablet by mouth daily. 12/28/21   Imagene Riches, CNM    No Known Allergies  Gynecologic History: Patient's last menstrual period was 12/07/2021 (exact date). History of abnormal pap smear: No History of STI: No   Obstetric History: G0P0000  Social  History   Socioeconomic History   Marital status: Single    Spouse name: Not on file   Number of children: 0   Years of education: 12   Highest education level: Not on file  Occupational History   Occupation: Soil scientist in [employer]  Tobacco Use   Smoking status: Never   Smokeless tobacco: Never  Vaping Use   Vaping Use: Never used  Substance and Sexual Activity   Alcohol use: No   Drug use: No   Sexual activity: Yes    Birth control/protection: Pill  Other Topics Concern   Not on file  Social History Narrative   Not on file   Social Determinants of Health   Financial Resource Strain: Not on file  Food Insecurity: Not on file  Transportation Needs: Not on file  Physical Activity: Not on file  Stress: Not on file  Social Connections: Not on file  Intimate Partner Violence: Not on file    Family History  Problem Relation Age of Onset   Cancer Mother 27       MELANOMA OF SKIN   Breast cancer Maternal Grandmother 70    ROS   Physical Exam BP 122/70   Ht '5\' 9"'$  (1.753 m)   Wt 150 lb (68 kg)   LMP 12/07/2021 (Exact Date)   BMI 22.15 kg/m    OBGyn Exam  Female chaperone present for pelvic and breast  portions of the physical exam  Results: AUDIT Questionnaire (screen for alcoholism): NA PHQ-9:  0   Assessment: 26 y.o. G0P0000 female here for routine annual gynecologic examination  Plan: Problem List Items Addressed This Visit       Other   Contraceptive management   Relevant Medications   norethindrone-ethinyl estradiol (LOESTRIN) 1-20 MG-MCG tablet   Other Visit Diagnoses     Cervical cancer screening    -  Primary   Relevant Orders   Cytology - PAP   Women's annual routine gynecological examination       Relevant Orders   Cytology - PAP       Screening: -- Blood pressure screen normal -- Weight screening: normal -- Depression screening negative (PHQ-9) -- Nutrition: normal -- cholesterol screening: not due for screening --  osteoporosis screening: not due -- tobacco screening: not using -- alcohol screening: AUDIT questionnaire indicates low-risk usage. -- family history of breast cancer screening: done. not at high risk. -- no evidence of domestic violence or intimate partner violence. -- STD screening: gonorrhea/chlamydia NAAT not collected per patient request. -- pap smear collected per ASCCP guidelines -- flu vaccine  per her PCP -- HPV vaccination series: received I have renewed her OCPs. RTC in one year for her next annual. Imagene Riches, CNM  12/28/2021 9:09 AM   12/28/2021 9:09 AM

## 2021-12-29 ENCOUNTER — Encounter: Payer: Self-pay | Admitting: Obstetrics

## 2021-12-29 LAB — CYTOLOGY - PAP: Diagnosis: NEGATIVE

## 2022-01-06 ENCOUNTER — Other Ambulatory Visit: Payer: Self-pay | Admitting: Dermatology

## 2022-01-06 DIAGNOSIS — L700 Acne vulgaris: Secondary | ICD-10-CM

## 2022-01-10 ENCOUNTER — Ambulatory Visit (INDEPENDENT_AMBULATORY_CARE_PROVIDER_SITE_OTHER): Payer: 59 | Admitting: Dermatology

## 2022-01-10 VITALS — BP 128/91 | HR 88

## 2022-01-10 DIAGNOSIS — Z79899 Other long term (current) drug therapy: Secondary | ICD-10-CM | POA: Diagnosis not present

## 2022-01-10 DIAGNOSIS — L700 Acne vulgaris: Secondary | ICD-10-CM

## 2022-01-10 NOTE — Progress Notes (Signed)
   Follow-Up Visit   Subjective  Roberta Gonzales is a 26 y.o. female who presents for the following: Acne (Pt currently using Spironolactone '25mg'$  po QD and Adapalene 0.3% gel very rarely - acne has improved since starting Sprionolactone. She hasn't had s/e from the Spironolactone).  The following portions of the chart were reviewed this encounter and updated as appropriate:   Tobacco  Allergies  Meds  Problems  Med Hx  Surg Hx  Fam Hx     Review of Systems:  No other skin or systemic complaints except as noted in HPI or Assessment and Plan.  Objective  Well appearing patient in no apparent distress; mood and affect are within normal limits.  A focused examination was performed including the face. Relevant physical exam findings are noted in the Assessment and Plan.   Assessment & Plan  Acne vulgaris Face  Chronic and persistent condition with duration or expected duration over one year. Condition is bothersome/symptomatic for patient. Currently flared.  Discussed increasing Spironolactone to '50mg'$  po QD but patient defers at this time.   Continue Spironolactone '25mg'$  po QD.  Increase use of Differin gel.  Spironolactone can cause increased urination and cause blood pressure to decrease. Please watch for signs of lightheadedness and be cautious when changing position. It can sometimes cause breast tenderness or an irregular period in premenopausal women. It can also increase potassium. The increase in potassium usually is not a concern unless you are taking other medicines that also increase potassium, so please be sure your doctor knows all of the other medications you are taking. This medication should not be taken by pregnant women.  This medicine should also not be taken together with sulfa drugs like Bactrim (trimethoprim/sulfamethexazole).   BP - 128/91 Heart rate - 88  Long term medication management.  Patient is using long term (months to years) prescription medication  to  control their dermatologic condition.  These medications require periodic monitoring to evaluate for efficacy and side effects and may require periodic laboratory monitoring.  Related Medications spironolactone (ALDACTONE) 25 MG tablet TAKE 1 TABLET BY MOUTH EVERY DAY IN THE MORNING  Return in about 6 months (around 07/13/2022) for acne follow up .  Luther Redo, CMA, am acting as scribe for Sarina Ser, MD . Documentation: I have reviewed the above documentation for accuracy and completeness, and I agree with the above.  Sarina Ser, MD

## 2022-01-10 NOTE — Patient Instructions (Signed)
Due to recent changes in healthcare laws, you may see results of your pathology and/or laboratory studies on MyChart before the doctors have had a chance to review them. We understand that in some cases there may be results that are confusing or concerning to you. Please understand that not all results are received at the same time and often the doctors may need to interpret multiple results in order to provide you with the best plan of care or course of treatment. Therefore, we ask that you please give us 2 business days to thoroughly review all your results before contacting the office for clarification. Should we see a critical lab result, you will be contacted sooner.   If You Need Anything After Your Visit  If you have any questions or concerns for your doctor, please call our main line at [phone] and press option 4 to reach your doctor's medical assistant. If no one answers, please leave a voicemail as directed and we will return your call as soon as possible. Messages left after 4 pm will be answered the following business day.   You may also send us a message via MyChart. We typically respond to MyChart messages within 1-2 business days.  For prescription refills, please ask your pharmacy to contact our office. Our fax number is [phone].  If you have an urgent issue when the clinic is closed that cannot wait until the next business day, you can page your doctor at the number below.    Please note that while we do our best to be available for urgent issues outside of office hours, we are not available 24/7.   If you have an urgent issue and are unable to reach us, you may choose to seek medical care at your doctor's office, retail clinic, urgent care center, or emergency room.  If you have a medical emergency, please immediately call 911 or go to the emergency department.  Pager Numbers  - Dr. Kowalski: [phone]  - Dr. Moye: [phone]  - Dr. Stewart:  [phone]  In the event of inclement weather, please call our main line at [phone] for an update on the status of any delays or closures.  Dermatology Medication Tips: Please keep the boxes that topical medications come in in order to help keep track of the instructions about where and how to use these. Pharmacies typically print the medication instructions only on the boxes and not directly on the medication tubes.   If your medication is too expensive, please contact our office at [phone] option 4 or send us a message through MyChart.   We are unable to tell what your co-pay for medications will be in advance as this is different depending on your insurance coverage. However, we may be able to find a substitute medication at lower cost or fill out paperwork to get insurance to cover a needed medication.   If a prior authorization is required to get your medication covered by your insurance company, please allow us 1-2 business days to complete this process.  Drug prices often vary depending on where the prescription is filled and some pharmacies may offer cheaper prices.  The website www.goodrx.com contains coupons for medications through different pharmacies. The prices here do not account for what the cost may be with help from insurance (it may be cheaper with your insurance), but the website can give you the price if you did not use any insurance.  - You can print the associated coupon and take it with   your prescription to the pharmacy.  - You may also stop by our office during regular business hours and pick up a GoodRx coupon card.  - If you need your prescription sent electronically to a different pharmacy, notify our office through [facility] MyChart or by phone at [phone] option 4.     Si Usted Necesita Algo Despus de Su Visita  Tambin puede enviarnos un mensaje a travs de MyChart. Por lo general respondemos a los mensajes de MyChart en el transcurso de 1 a 2  das hbiles.  Para renovar recetas, por favor pida a su farmacia que se ponga en contacto con nuestra oficina. Nuestro nmero de fax es el [phone].  Si tiene un asunto urgente cuando la clnica est cerrada y que no puede esperar hasta el siguiente da hbil, puede llamar/localizar a su doctor(a) al nmero que aparece a continuacin.   Por favor, tenga en cuenta que aunque hacemos todo lo posible para estar disponibles para asuntos urgentes fuera del horario de oficina, no estamos disponibles las 24 horas del da, los 7 das de la semana.   Si tiene un problema urgente y no puede comunicarse con nosotros, puede optar por buscar atencin mdica  en el consultorio de su doctor(a), en una clnica privada, en un centro de atencin urgente o en una sala de emergencias.  Si tiene una emergencia mdica, por favor llame inmediatamente al 911 o vaya a la sala de emergencias.  Nmeros de bper  - Dr. Kowalski: [phone]  - Dra. Moye: [phone]  - Dra. Stewart: [phone]  En caso de inclemencias del tiempo, por favor llame a nuestra lnea principal al [phone] para una actualizacin sobre el estado de cualquier retraso o cierre.  Consejos para la medicacin en dermatologa: Por favor, guarde las cajas en las que vienen los medicamentos de uso tpico para ayudarle a seguir las instrucciones sobre dnde y cmo usarlos. Las farmacias generalmente imprimen las instrucciones del medicamento slo en las cajas y no directamente en los tubos del medicamento.   Si su medicamento es muy caro, por favor, pngase en contacto con nuestra oficina llamando al [phone] y presione la opcin 4 o envenos un mensaje a travs de MyChart.   No podemos decirle cul ser su copago por los medicamentos por adelantado ya que esto es diferente dependiendo de la cobertura de su seguro. Sin embargo, es posible que podamos encontrar un medicamento sustituto a menor costo o llenar un formulario para que el  seguro cubra el medicamento que se considera necesario.   Si se requiere una autorizacin previa para que su compaa de seguros cubra su medicamento, por favor permtanos de 1 a 2 das hbiles para completar este proceso.  Los precios de los medicamentos varan con frecuencia dependiendo del lugar de dnde se surte la receta y alguna farmacias pueden ofrecer precios ms baratos.  El sitio web www.goodrx.com tiene cupones para medicamentos de diferentes farmacias. Los precios aqu no tienen en cuenta lo que podra costar con la ayuda del seguro (puede ser ms barato con su seguro), pero el sitio web puede darle el precio si no utiliz ningn seguro.  - Puede imprimir el cupn correspondiente y llevarlo con su receta a la farmacia.  - Tambin puede pasar por nuestra oficina durante el horario de atencin regular y recoger una tarjeta de cupones de GoodRx.  - Si necesita que su receta se enve electrnicamente a una farmacia diferente, informe a nuestra oficina a travs de MyChart de [facility]   o por telfono llamando al [phone] y presione la opcin 4.  

## 2022-01-14 ENCOUNTER — Encounter: Payer: Self-pay | Admitting: Dermatology

## 2022-04-17 ENCOUNTER — Other Ambulatory Visit: Payer: Self-pay | Admitting: Dermatology

## 2022-04-17 DIAGNOSIS — L700 Acne vulgaris: Secondary | ICD-10-CM

## 2022-07-16 ENCOUNTER — Ambulatory Visit (INDEPENDENT_AMBULATORY_CARE_PROVIDER_SITE_OTHER): Payer: 59 | Admitting: Dermatology

## 2022-07-16 VITALS — BP 127/79

## 2022-07-16 DIAGNOSIS — Z79899 Other long term (current) drug therapy: Secondary | ICD-10-CM

## 2022-07-16 DIAGNOSIS — L700 Acne vulgaris: Secondary | ICD-10-CM | POA: Diagnosis not present

## 2022-07-16 MED ORDER — SPIRONOLACTONE 25 MG PO TABS: ORAL_TABLET | ORAL | 1 refills | Status: DC

## 2022-07-16 NOTE — Patient Instructions (Signed)
Spironolactone can cause increased urination and cause blood pressure to decrease. Please watch for signs of lightheadedness and be cautious when changing position. It can sometimes cause breast tenderness or an irregular period in premenopausal women. It can also increase potassium. The increase in potassium usually is not a concern unless you are taking other medicines that also increase potassium, so please be sure your doctor knows all of the other medications you are taking. This medication should not be taken by pregnant women.  This medicine should also not be taken together with sulfa drugs like Bactrim (trimethoprim/sulfamethexazole).      Due to recent changes in healthcare laws, you may see results of your pathology and/or laboratory studies on MyChart before the doctors have had a chance to review them. We understand that in some cases there may be results that are confusing or concerning to you. Please understand that not all results are received at the same time and often the doctors may need to interpret multiple results in order to provide you with the best plan of care or course of treatment. Therefore, we ask that you please give us 2 business days to thoroughly review all your results before contacting the office for clarification. Should we see a critical lab result, you will be contacted sooner.   If You Need Anything After Your Visit  If you have any questions or concerns for your doctor, please call our main line at [phone] and press option 4 to reach your doctor's medical assistant. If no one answers, please leave a voicemail as directed and we will return your call as soon as possible. Messages left after 4 pm will be answered the following business day.   You may also send us a message via MyChart. We typically respond to MyChart messages within 1-2 business days.  For prescription refills, please ask your pharmacy to contact our office. Our fax number is  [phone].  If you have an urgent issue when the clinic is closed that cannot wait until the next business day, you can page your doctor at the number below.    Please note that while we do our best to be available for urgent issues outside of office hours, we are not available 24/7.   If you have an urgent issue and are unable to reach us, you may choose to seek medical care at your doctor's office, retail clinic, urgent care center, or emergency room.  If you have a medical emergency, please immediately call 911 or go to the emergency department.  Pager Numbers  - Dr. Kowalski: [phone]  - Dr. Moye: [phone]  - Dr. Stewart: [phone]  In the event of inclement weather, please call our main line at [phone] for an update on the status of any delays or closures.  Dermatology Medication Tips: Please keep the boxes that topical medications come in in order to help keep track of the instructions about where and how to use these. Pharmacies typically print the medication instructions only on the boxes and not directly on the medication tubes.   If your medication is too expensive, please contact our office at [phone] option 4 or send us a message through MyChart.   We are unable to tell what your co-pay for medications will be in advance as this is different depending on your insurance coverage. However, we may be able to find a substitute medication at lower cost or fill out paperwork to get insurance to cover a needed medication.   If   a prior authorization is required to get your medication covered by your insurance company, please allow us 1-2 business days to complete this process.  Drug prices often vary depending on where the prescription is filled and some pharmacies may offer cheaper prices.  The website www.goodrx.com contains coupons for medications through different pharmacies. The prices here do not account for what the cost may be with help from  insurance (it may be cheaper with your insurance), but the website can give you the price if you did not use any insurance.  - You can print the associated coupon and take it with your prescription to the pharmacy.  - You may also stop by our office during regular business hours and pick up a GoodRx coupon card.  - If you need your prescription sent electronically to a different pharmacy, notify our office through [facility] MyChart or by phone at [phone] option 4.     Si Usted Necesita Algo Despus de Su Visita  Tambin puede enviarnos un mensaje a travs de MyChart. Por lo general respondemos a los mensajes de MyChart en el transcurso de 1 a 2 das hbiles.  Para renovar recetas, por favor pida a su farmacia que se ponga en contacto con nuestra oficina. Nuestro nmero de fax es el [phone].  Si tiene un asunto urgente cuando la clnica est cerrada y que no puede esperar hasta el siguiente da hbil, puede llamar/localizar a su doctor(a) al nmero que aparece a continuacin.   Por favor, tenga en cuenta que aunque hacemos todo lo posible para estar disponibles para asuntos urgentes fuera del horario de oficina, no estamos disponibles las 24 horas del da, los 7 das de la semana.   Si tiene un problema urgente y no puede comunicarse con nosotros, puede optar por buscar atencin mdica  en el consultorio de su doctor(a), en una clnica privada, en un centro de atencin urgente o en una sala de emergencias.  Si tiene una emergencia mdica, por favor llame inmediatamente al 911 o vaya a la sala de emergencias.  Nmeros de bper  - Dr. Kowalski: [phone]  - Dra. Moye: [phone]  - Dra. Stewart: [phone]  En caso de inclemencias del tiempo, por favor llame a nuestra lnea principal al [phone] para una actualizacin sobre el estado de cualquier retraso o cierre.  Consejos para la medicacin en dermatologa: Por favor, guarde las cajas en las que vienen los  medicamentos de uso tpico para ayudarle a seguir las instrucciones sobre dnde y cmo usarlos. Las farmacias generalmente imprimen las instrucciones del medicamento slo en las cajas y no directamente en los tubos del medicamento.   Si su medicamento es muy caro, por favor, pngase en contacto con nuestra oficina llamando al [phone] y presione la opcin 4 o envenos un mensaje a travs de MyChart.   No podemos decirle cul ser su copago por los medicamentos por adelantado ya que esto es diferente dependiendo de la cobertura de su seguro. Sin embargo, es posible que podamos encontrar un medicamento sustituto a menor costo o llenar un formulario para que el seguro cubra el medicamento que se considera necesario.   Si se requiere una autorizacin previa para que su compaa de seguros cubra su medicamento, por favor permtanos de 1 a 2 das hbiles para completar este proceso.  Los precios de los medicamentos varan con frecuencia dependiendo del lugar de dnde se surte la receta y alguna farmacias pueden ofrecer precios ms baratos.  El sitio web www.goodrx.com tiene   diferentes farmacias. Los precios aqu no tienen en cuenta lo que podra costar con la ayuda del seguro (puede ser ms barato con su seguro), pero el sitio web puede darle el precio si no utiliz ningn seguro.  - Puede imprimir el cupn correspondiente y llevarlo con su receta a la farmacia.  - Tambin puede pasar por nuestra oficina durante el horario de atencin regular y recoger una tarjeta de cupones de GoodRx.  - Si necesita que su receta se enve electrnicamente a una farmacia diferente, informe a nuestra oficina a travs de MyChart de [facility] o por telfono llamando al [phone] y presione la opcin 4.  

## 2022-07-16 NOTE — Progress Notes (Signed)
   Follow-Up Visit   Subjective  Roberta Gonzales is a 27 y.o. female who presents for the following: Acne (6 month follow up - Spironolactone 25 mg 1 po qd - staying mostly clear except for a small flare around menses).  The following portions of the chart were reviewed this encounter and updated as appropriate:   Tobacco  Allergies  Meds  Problems  Med Hx  Surg Hx  Fam Hx     Review of Systems:  No other skin or systemic complaints except as noted in HPI or Assessment and Plan.  Objective  Well appearing patient in no apparent distress; mood and affect are within normal limits.  A focused examination was performed including face. Relevant physical exam findings are noted in the Assessment and Plan.   Assessment & Plan  Acne vulgaris Face  Discussed increasing spironolactone. Patient feels she is staying well controlled and prefers to not increase spironolactone at this time. Advised patient if she plans to become pregnant, she will need to discontinue. She would like to continue it at least until December of this year then try to stop.  Continue Differin 0.3% gel as needed - advised her that daily use may improve her results more. May discuss Clindamycin/Tretinoin mix on follow up.  Related Medications spironolactone (ALDACTONE) 25 MG tablet Take one tablet daily   Return in about 6 months (around 01/14/2023) for Acne.  I, Ashok Cordia, CMA, am acting as scribe for Sarina Ser, MD . Documentation: I have reviewed the above documentation for accuracy and completeness, and I agree with the above.  Sarina Ser, MD

## 2022-07-20 ENCOUNTER — Encounter: Payer: Self-pay | Admitting: Dermatology

## 2023-01-03 ENCOUNTER — Ambulatory Visit: Payer: 59 | Admitting: Dermatology

## 2023-01-14 ENCOUNTER — Ambulatory Visit (INDEPENDENT_AMBULATORY_CARE_PROVIDER_SITE_OTHER): Payer: 59 | Admitting: Dermatology

## 2023-01-14 VITALS — BP 122/82

## 2023-01-14 DIAGNOSIS — L700 Acne vulgaris: Secondary | ICD-10-CM | POA: Diagnosis not present

## 2023-01-14 DIAGNOSIS — Z808 Family history of malignant neoplasm of other organs or systems: Secondary | ICD-10-CM | POA: Diagnosis not present

## 2023-01-14 DIAGNOSIS — Z79899 Other long term (current) drug therapy: Secondary | ICD-10-CM

## 2023-01-14 DIAGNOSIS — L820 Inflamed seborrheic keratosis: Secondary | ICD-10-CM | POA: Diagnosis not present

## 2023-01-14 MED ORDER — CLINDAMYCIN PHOSPHATE 1 % EX LOTN: TOPICAL_LOTION | CUTANEOUS | 6 refills | Status: AC

## 2023-01-14 NOTE — Patient Instructions (Addendum)
Start over the counter benzoyl peroxide wash in shower Start Clindamycin lotion one to two times a day to face for acne   Seborrheic Keratosis  What causes seborrheic keratoses? Seborrheic keratoses are harmless, common skin growths that first appear during adult life.  As time goes by, more growths appear.  Some people may develop a large number of them.  Seborrheic keratoses appear on both covered and uncovered body parts.  They are not caused by sunlight.  The tendency to develop seborrheic keratoses can be inherited.  They vary in color from skin-colored to gray, brown, or even black.  They can be either smooth or have a rough, warty surface.   Seborrheic keratoses are superficial and look as if they were stuck on the skin.  Under the microscope this type of keratosis looks like layers upon layers of skin.  That is why at times the top layer may seem to fall off, but the rest of the growth remains and re-grows.    Treatment Seborrheic keratoses do not need to be treated, but can easily be removed in the office.  Seborrheic keratoses often cause symptoms when they rub on clothing or jewelry.  Lesions can be in the way of shaving.  If they become inflamed, they can cause itching, soreness, or burning.  Removal of a seborrheic keratosis can be accomplished by freezing, burning, or surgery. If any spot bleeds, scabs, or grows rapidly, please return to have it checked, as these can be an indication of a skin cancer.   Due to recent changes in healthcare laws, you may see results of your pathology and/or laboratory studies on MyChart before the doctors have had a chance to review them. We understand that in some cases there may be results that are confusing or concerning to you. Please understand that not all results are received at the same time and often the doctors may need to interpret multiple results in order to provide you with the best plan of care or course of treatment. Therefore, we ask that  you please give [place] 2 business days to thoroughly review all your results before contacting the office for clarification. Should we see a critical lab result, you will be contacted sooner.   If You Need Anything After Your Visit  If you have any questions or concerns for your doctor, please call our main line at [phone] and press option 4 to reach your doctor's medical assistant. If no one answers, please leave a voicemail as directed and we will return your call as soon as possible. Messages left after 4 pm will be answered the following business day.   You may also send [place] a message via MyChart. We typically respond to MyChart messages within 1-2 business days.  For prescription refills, please ask your pharmacy to contact our office. Our fax number is [phone].  If you have an urgent issue when the clinic is closed that cannot wait until the next business day, you can page your doctor at the number below.    Please note that while we do our best to be available for urgent issues outside of office hours, we are not available 24/7.   If you have an urgent issue and are unable to reach [place], you may choose to seek medical care at your doctor's office, retail clinic, urgent care center, or emergency room.  If you have a medical emergency, please immediately call 911 or go to the emergency department.  Pager Numbers  - Dr.  Gwen Pounds: [phone]  - Dr. Neale Burly: [phone]  - Dr. Roseanne Reno: [phone]  In the event of inclement weather, please call our main line at [phone] for an update on the status of any delays or closures.  Dermatology Medication Tips: Please keep the boxes that topical medications come in in order to help keep track of the instructions about where and how to use these. Pharmacies typically print the medication instructions only on the boxes and not directly on the medication tubes.   If your medication is too expensive, please contact our office at  [phone] option 4 or send [place] a message through MyChart.   We are unable to tell what your co-pay for medications will be in advance as this is different depending on your insurance coverage. However, we may be able to find a substitute medication at lower cost or fill out paperwork to get insurance to cover a needed medication.   If a prior authorization is required to get your medication covered by your insurance company, please allow [place] 1-2 business days to complete this process.  Drug prices often vary depending on where the prescription is filled and some pharmacies may offer cheaper prices.  The website www.goodrx.com contains coupons for medications through different pharmacies. The prices here do not account for what the cost may be with help from insurance (it may be cheaper with your insurance), but the website can give you the price if you did not use any insurance.  - You can print the associated coupon and take it with your prescription to the pharmacy.  - You may also stop by our office during regular business hours and pick up a GoodRx coupon card.  - If you need your prescription sent electronically to a different pharmacy, notify our office through [facility] or by phone at [phone] option 4.     Si Usted Necesita Algo Despus de Su Visita  Tambin puede enviarnos un mensaje a travs de Clinical cytogeneticist. Por lo general respondemos a los mensajes de MyChart en el transcurso de 1 a 2 das hbiles.  Para renovar recetas, por favor pida a su farmacia que se ponga en contacto con nuestra oficina. Annie Sable de fax es [place] [phone].  Si tiene un asunto urgente cuando la clnica est cerrada y que no puede esperar hasta el siguiente da hbil, puede llamar/localizar a su doctor(a) al nmero que aparece a continuacin.   Por favor, tenga en cuenta que aunque hacemos todo lo posible para estar disponibles para asuntos urgentes fuera del horario de [place], no estamos  disponibles las 24 horas del da, los 7 [address]  [address] de la [place].   Si tiene un problema urgente y no puede comunicarse con nosotros, puede optar por buscar atencin mdica  en el consultorio de su doctor(a), en una clnica privada, en un centro de atencin urgente o en una sala de emergencias.  Si tiene Engineer, drilling, por favor llame inmediatamente al 911 o vaya a la sala de emergencias.  Nmeros de bper  - Dr. Gwen Pounds: [phone]  - Dra. Moye: [phone]  - Dra. Roseanne Reno: [phone]  En caso de inclemencias del [place], por favor llame a Lacy Duverney principal al [phone] para una actualizacin sobre el [place] de cualquier retraso o cierre.  Consejos para la medicacin en dermatologa: Por favor, guarde las cajas en las que vienen los medicamentos de uso tpico para ayudarle a seguir las instrucciones sobre dnde y cmo usarlos. Las farmacias generalmente imprimen las instrucciones del medicamento slo  en las cajas y no directamente en los tubos del medicamento.   Si su medicamento es muy caro, por favor, pngase en contacto con Rolm Gala llamando al [phone] y presione la opcin 4 o envenos un mensaje a travs de Clinical cytogeneticist.   No podemos decirle cul ser su copago por los medicamentos por adelantado ya que esto es diferente dependiendo de la cobertura de su seguro. Sin embargo, es posible que podamos encontrar un medicamento sustituto a Audiological scientist un formulario para que el seguro cubra el medicamento que se considera necesario.   Si se requiere una autorizacin previa para que su compaa de seguros [place] su medicamento, por favor permtanos de 1 a 2 das hbiles para completar [address]  Los precios de los medicamentos varan con frecuencia dependiendo del Environmental consultant de dnde se surte la receta y alguna farmacias pueden ofrecer precios ms baratos.  El sitio web www.goodrx.com tiene cupones para medicamentos de Health and safety inspector. Los precios aqu no  tienen en cuenta lo que podra costar con la ayuda del seguro (puede ser ms barato con su seguro), pero el sitio web puede darle el precio si no utiliz Tourist information centre manager.  - Puede imprimir el cupn correspondiente y llevarlo con su receta a la farmacia.  - Tambin puede pasar por nuestra oficina durante el horario de atencin regular y Education officer, museum una tarjeta de cupones de GoodRx.  - Si necesita que su receta se enve electrnicamente a una farmacia diferente, informe a nuestra oficina a travs de MyChart de [place] o por telfono llamando al [phone] y presione la opcin 4.

## 2023-01-14 NOTE — Progress Notes (Unsigned)
   Follow-Up Visit   Subjective  Roberta Gonzales is a 27 y.o. female who presents for the following: Acne Vulgaris, face, Not using Differin or Spironolactone, x 2 weeks, pt d/c due to getting body ready for future pregnancy, pt has been breaking out check spot chest, recently appeared, itchy prn  The following portions of the chart were reviewed this encounter and updated as appropriate: medications, allergies, medical history  Review of Systems:  No other skin or systemic complaints except as noted in HPI or Assessment and Plan.  Objective  Well appearing patient in no apparent distress; mood and affect are within normal limits. Areas Examined: Face, chest Relevant exam findings are noted in the Assessment and Plan.  sternum x 1 Stuck on waxy paps with erythema   Assessment & Plan   Inflamed seborrheic keratosis sternum x 1 Symptomatic, irritating, patient would like treated. Destruction of lesion - sternum x 1 Complexity: simple   Destruction method: cryotherapy   Informed consent: discussed and consent obtained   Timeout:  patient name, date of birth, surgical site, and procedure verified Lesion destroyed using liquid nitrogen: Yes   Region frozen until ice ball extended beyond lesion: Yes   Outcome: patient tolerated procedure well with no complications   Post-procedure details: wound care instructions given    ACNE VULGARIS face Exam: pap R cheek Chronic and persistent condition with duration or expected duration over one year. Condition is symptomatic/ bothersome to patient. Not currently at goal.  Treatment Plan: Discussed topical treatments if trying to get pregnant Start Clindamycin lotion qd/bid to face, (discussed if pt gets pregnant to discuss with OB/GYN whether they advise continuing Clindamycin) Start otc BP wash qd to  wash face = Panoxyl Wash  Benzoyl peroxide can cause dryness and irritation of the skin. It can also bleach fabric. When used together with  Aczone (dapsone) cream, it can stain the skin orange.   Family History of Melanoma in mother Recommend skin cancer screening exam next visit.  Return for 66m to 1 yr TBSE, Acne f/u.  I, Ardis Rowan, RMA, am acting as scribe for Armida Sans, MD .  Documentation: I have reviewed the above documentation for accuracy and completeness, and I agree with the above.  Armida Sans, MD

## 2023-01-15 ENCOUNTER — Encounter: Payer: Self-pay | Admitting: Dermatology

## 2023-01-29 ENCOUNTER — Other Ambulatory Visit: Payer: Self-pay | Admitting: Dermatology

## 2023-01-29 DIAGNOSIS — L700 Acne vulgaris: Secondary | ICD-10-CM

## 2023-06-19 NOTE — L&D Delivery Note (Signed)
 Delivery Note  Roberta Gonzales is a G1P0000 at [redacted]w[redacted]d with an LMP of 08/08/23, consistent with US  at [redacted]w[redacted]d.   First Stage: Labor onset: 0232 Augmentation: oxytocin  and AROM Analgesia /Anesthesia intrapartum: Epidural and IV pain meds Date/time: 05/18/24 @ 1149 Amount: gush, and Color: clear GBS: Negative/-- (11/03 0000)  IP Antibiotics: abx: none  Second Stage: Complete dilation at 1828 Onset of pushing at 1839 FHR second stage Baseline: 135 bpm, Variability: Good {> 6 bpm), Accelerations: Reactive, and Decelerations: Variable: moderate decels with pushing   [name] presented to L&D for labor She was 2/60/-1. She progressed  to C/C/+2 with a spontaneous urge to push.  She pushed  effectively over approximately 50 minutes for a spontaneous vaginal birth. Delivery of a viable baby boy on 05/18/2024 . by CNM. Delivery of fetal head in position: Occiput,, Anterior position with restitution to position: Right,, Occiput,, Transverse tight nuchal cord, reduced;  Anterior then posterior shoulders delivered easily with gentle downward traction. Baby placed on mom's chest, and attended to by baby RN. Cord double clamped after cessation of pulsation, cut by FOB    Third Stage: Oxytocin  bolus started after delivery of placenta for hemorrhage prophylaxis, followed by TXA Placenta delivered schultz intact with 3 VC @ 2036 Placenta disposition:.discarded per protocol To Pathology: No  Uterine tone firm / exam; vaginal bleeding: moderate  Laceration: 1st degree, 2nd degree, vaginal, labial, and periurethral laceration identified  Anesthesia for repair: procedures; anesthesia: local and epidural Repair suture type: 2.0 3.0 vicryl Est. Blood Loss (mL): 450  Complications:Diagnoses; OB-GYN delivery complications: none  Mom to postpartum.  Baby to Couplet care / Skin to Skin.  Newborn: Information for the patient's newborn:  [name], Poplar [[id number]]  Live born female  Birth Weight: 9 lb 1.3 oz  (4120 g) APGAR: 8, 9  Newborn Delivery   Birth date/time: 05/18/2024 20:29:00 Delivery type: Vaginal, Spontaneous      Feeding planned: breast feeding  ---------- Bobbette Brunswick, CNM Certified Nurse Midwife [facility]

## 2023-10-02 ENCOUNTER — Ambulatory Visit: Payer: 59 | Admitting: Dermatology

## 2023-10-23 ENCOUNTER — Ambulatory Visit: Payer: 59 | Admitting: Dermatology

## 2023-10-25 DIAGNOSIS — Z3401 Encounter for supervision of normal first pregnancy, first trimester: Secondary | ICD-10-CM | POA: Insufficient documentation

## 2023-10-25 LAB — HEPATITIS C ANTIBODY: HCV Ab: NEGATIVE

## 2023-10-25 LAB — OB RESULTS CONSOLE VARICELLA ZOSTER ANTIBODY, IGG: Varicella: IMMUNE

## 2023-10-25 LAB — OB RESULTS CONSOLE RUBELLA ANTIBODY, IGM: Rubella: IMMUNE

## 2023-10-25 LAB — OB RESULTS CONSOLE RPR: RPR: NONREACTIVE

## 2023-10-25 LAB — OB RESULTS CONSOLE GC/CHLAMYDIA
Chlamydia: NEGATIVE
Neisseria Gonorrhea: NEGATIVE

## 2023-10-25 LAB — OB RESULTS CONSOLE HIV ANTIBODY (ROUTINE TESTING): HIV: NONREACTIVE

## 2023-10-25 LAB — OB RESULTS CONSOLE HEPATITIS B SURFACE ANTIGEN: Hepatitis B Surface Ag: NEGATIVE

## 2024-04-07 ENCOUNTER — Ambulatory Visit: Admitting: Dermatology

## 2024-04-16 LAB — OB RESULTS CONSOLE GBS: GBS: NEGATIVE

## 2024-04-20 LAB — OB RESULTS CONSOLE GBS: GBS: NEGATIVE

## 2024-04-29 ENCOUNTER — Ambulatory Visit: Admitting: Dermatology

## 2024-04-29 DIAGNOSIS — L578 Other skin changes due to chronic exposure to nonionizing radiation: Secondary | ICD-10-CM | POA: Diagnosis not present

## 2024-04-29 DIAGNOSIS — Z808 Family history of malignant neoplasm of other organs or systems: Secondary | ICD-10-CM

## 2024-04-29 DIAGNOSIS — L814 Other melanin hyperpigmentation: Secondary | ICD-10-CM

## 2024-04-29 DIAGNOSIS — D225 Melanocytic nevi of trunk: Secondary | ICD-10-CM

## 2024-04-29 DIAGNOSIS — C44712 Basal cell carcinoma of skin of right lower limb, including hip: Secondary | ICD-10-CM

## 2024-04-29 DIAGNOSIS — D229 Melanocytic nevi, unspecified: Secondary | ICD-10-CM

## 2024-04-29 DIAGNOSIS — L821 Other seborrheic keratosis: Secondary | ICD-10-CM

## 2024-04-29 DIAGNOSIS — W908XXA Exposure to other nonionizing radiation, initial encounter: Secondary | ICD-10-CM | POA: Diagnosis not present

## 2024-04-29 DIAGNOSIS — D2261 Melanocytic nevi of right upper limb, including shoulder: Secondary | ICD-10-CM

## 2024-04-29 DIAGNOSIS — L989 Disorder of the skin and subcutaneous tissue, unspecified: Secondary | ICD-10-CM

## 2024-04-29 DIAGNOSIS — Z1283 Encounter for screening for malignant neoplasm of skin: Secondary | ICD-10-CM

## 2024-04-29 DIAGNOSIS — D489 Neoplasm of uncertain behavior, unspecified: Secondary | ICD-10-CM

## 2024-04-29 DIAGNOSIS — C4491 Basal cell carcinoma of skin, unspecified: Secondary | ICD-10-CM

## 2024-04-29 HISTORY — DX: Basal cell carcinoma of skin, unspecified: C44.91

## 2024-04-29 NOTE — Progress Notes (Signed)
 Follow-Up Visit   Subjective  Roberta Gonzales is a 28 y.o. female who presents for the following: Skin Cancer Screening and Full Body Skin Exam Family history of melanoma in mother, hx of acne not currently using treatment due to pregnancy, hx of isks Reports a spot on right lower leg she would like checked.  The patient presents for Total-Body Skin Exam (TBSE) for skin cancer screening and mole check. The patient has spots, moles and lesions to be evaluated, some may be new or changing and the patient may have concern these could be cancer.  The following portions of the chart were reviewed this encounter and updated as appropriate: medications, allergies, medical history  Review of Systems:  No other skin or systemic complaints except as noted in HPI or Assessment and Plan.  Objective  Well appearing patient in no apparent distress; mood and affect are within normal limits.  A full examination was performed including scalp, head, eyes, ears, nose, lips, neck, chest, axillae, abdomen, back, buttocks, bilateral upper extremities, bilateral lower extremities, hands, feet, fingers, toes, fingernails, and toenails. All findings within normal limits unless otherwise noted below.   Relevant physical exam findings are noted in the Assessment and Plan.  Left lower commissure Mole vs Lentigo  Left lower commissure Mole vs Lentigo    Right dorsum wrist - irregular nevus - will bx at next followup   Right dorsum wrist - irregular nevus - will bx at next followup   Irregular Nevi at left upper quadrant abdomen x 2 Will plan bx at next followup   Irregular Nevi at left upper quadrant abdomen x 2 Will plan bx at next followup   Irregular Nevi at left upper quadrant abdomen x 2 Will plan bx at next followup   right mid lateral pretibial 0.7 cm irregular pink macule    Assessment & Plan    Family History of Melanoma in mother Recommend skin cancer screening exam next  visit.  SKIN CANCER SCREENING PERFORMED TODAY.  ACTINIC DAMAGE - Chronic condition, secondary to cumulative UV/sun exposure - diffuse scaly erythematous macules with underlying dyspigmentation - Recommend daily broad spectrum sunscreen SPF 30+ to sun-exposed areas, reapply every 2 hours as needed.  - Staying in the shade or wearing long sleeves, sun glasses (UVA+UVB protection) and wide brim hats (4-inch brim around the entire circumference of the hat) are also recommended for sun protection.  - Call for new or changing lesions.  LENTIGINES, SEBORRHEIC KERATOSES, HEMANGIOMAS - Benign normal skin lesions - Benign-appearing - Call for any changes  MELANOCYTIC NEVI -recommend removal Nevus  - irregular brown macule at right dorsum wrist  see photo  - irregular brown macules at left upper quadrant abdomen x 2 see photos Will plan shave removal at next follow up in February after patient delivers her baby.  She wants to wait until after baby delivery.   Nevus vs Lentigo at Left oral commissure  Present for many many years without change. Exam: see photos.  Regular dark brown macule Treatment Plan: No change for many years Benign-appearing.  Observation.  Call clinic for new or changing moles.  Recommend daily use of broad spectrum spf 30+ sunscreen to sun-exposed areas.   NEOPLASM OF UNCERTAIN BEHAVIOR right mid lateral pretibial Epidermal / dermal shaving  Lesion diameter (cm):  0.7 Informed consent: discussed and consent obtained   Timeout: patient name, date of birth, surgical site, and procedure verified   Procedure prep:  Patient was prepped and draped in  usual sterile fashion Prep type:  Isopropyl alcohol Anesthesia: the lesion was anesthetized in a standard fashion   Anesthetic:  1% lidocaine w/ epinephrine 1-100,000 buffered w/ 8.4% NaHCO3 Instrument used: flexible razor blade   Hemostasis achieved with: pressure, aluminum chloride and electrodesiccation   Outcome:  patient tolerated procedure well   Post-procedure details: sterile dressing applied and wound care instructions given   Dressing type: bandage and petrolatum    Specimen 1 - Surgical pathology Differential Diagnosis: dermatofibroma vs ca   Check Margins: yes R/o dermatofibroma vs ca Return for february 2026 followup; on irregular moles and shv removal , 1 year tbse .  IEleanor Blush, CMA, am acting as scribe for Alm Rhyme, MD.   Documentation: I have reviewed the above documentation for accuracy and completeness, and I agree with the above.  Alm Rhyme, MD

## 2024-04-29 NOTE — Patient Instructions (Addendum)
 Biopsy Wound Care Instructions  Leave the original bandage on for 24 hours if possible.  If the bandage becomes soaked or soiled before that time, it is OK to remove it and examine the wound.  A small amount of post-operative bleeding is normal.  If excessive bleeding occurs, remove the bandage, place gauze over the site and apply continuous pressure (no peeking) over the area for 30 minutes. If this does not work, please call our clinic as soon as possible or page your doctor if it is after hours.   Once a day, cleanse the wound with soap and water. It is fine to shower. If a thick crust develops you may use a Q-tip dipped into dilute hydrogen peroxide (mix 1:1 with water) to dissolve it.  Hydrogen peroxide can slow the healing process, so use it only as needed.    After washing, apply petroleum jelly (Vaseline) or an antibiotic ointment if your doctor prescribed one for you, followed by a bandage.    For best healing, the wound should be covered with a layer of ointment at all times. If you are not able to keep the area covered with a bandage to hold the ointment in place, this may mean re-applying the ointment several times a day.  Continue this wound care until the wound has healed and is no longer open.   Itching and mild discomfort is normal during the healing process. However, if you develop pain or severe itching, please call our office.   If you have any discomfort, you can take Tylenol (acetaminophen) or ibuprofen as directed on the bottle. (Please do not take these if you have an allergy to them or cannot take them for another reason).  Some redness, tenderness and white or yellow material in the wound is normal healing.  If the area becomes very sore and red, or develops a thick yellow-green material (pus), it may be infected; please notify us .    If you have stitches, return to clinic as directed to have the stitches removed. You will continue wound care for 2-3 days after the stitches  are removed.   Wound healing continues for up to one year following surgery. It is not unusual to experience pain in the scar from time to time during the interval.  If the pain becomes severe or the scar thickens, you should notify the office.    A slight amount of redness in a scar is expected for the first six months.  After six months, the redness will fade and the scar will soften and fade.  The color difference becomes less noticeable with time.  If there are any problems, return for a post-op surgery check at your earliest convenience.  To improve the appearance of the scar, you can use silicone scar gel, cream, or sheets (such as Mederma or Serica) every night for up to one year. These are available over the counter (without a prescription).  Please call our office at [phone] for any questions or concerns.       Melanoma ABCDEs  Melanoma is the most dangerous type of skin cancer, and is the leading cause of death from skin disease.  You are more likely to develop melanoma if you: Have light-colored skin, light-colored eyes, or red or blond hair Spend a lot of time in the sun Tan regularly, either outdoors or in a tanning bed Have had blistering sunburns, especially during childhood Have a close family member who has had a melanoma Have atypical moles  or large birthmarks  Early detection of melanoma is key since treatment is typically straightforward and cure rates are extremely high if we catch it early.   The first sign of melanoma is often a change in a mole or a new dark spot.  The ABCDE system is a way of remembering the signs of melanoma.  A for asymmetry:  The two halves do not match. B for border:  The edges of the growth are irregular. C for color:  A mixture of colors are present instead of an even brown color. D for diameter:  Melanomas are usually (but not always) greater than 6mm - the size of a pencil eraser. E for evolution:  The spot keeps changing in  size, shape, and color.  Please check your skin once per month between visits. You can use a small mirror in front and a large mirror behind you to keep an eye on the back side or your body.   If you see any new or changing lesions before your next follow-up, please call to schedule a visit.  Please continue daily skin protection including broad spectrum sunscreen SPF 30+ to sun-exposed areas, reapplying every 2 hours as needed when you're outdoors.   Staying in the shade or wearing long sleeves, sun glasses (UVA+UVB protection) and wide brim hats (4-inch brim around the entire circumference of the hat) are also recommended for sun protection.     Due to recent changes in healthcare laws, you may see results of your pathology and/or laboratory studies on MyChart before the doctors have had a chance to review them. We understand that in some cases there may be results that are confusing or concerning to you. Please understand that not all results are received at the same time and often the doctors may need to interpret multiple results in order to provide you with the best plan of care or course of treatment. Therefore, we ask that you please give us  2 business days to thoroughly review all your results before contacting the office for clarification. Should we see a critical lab result, you will be contacted sooner.   If You Need Anything After Your Visit  If you have any questions or concerns for your doctor, please call our main line at [phone] and press option 4 to reach your doctor's medical assistant. If no one answers, please leave a voicemail as directed and we will return your call as soon as possible. Messages left after 4 pm will be answered the following business day.   You may also send us  a message via MyChart. We typically respond to MyChart messages within 1-2 business days.  For prescription refills, please ask your pharmacy to contact our office. Our fax number is  [phone].  If you have an urgent issue when the clinic is closed that cannot wait until the next business day, you can page your doctor at the number below.    Please note that while we do our best to be available for urgent issues outside of office hours, we are not available 24/7.   If you have an urgent issue and are unable to reach us , you may choose to seek medical care at your doctor's office, retail clinic, urgent care center, or emergency room.  If you have a medical emergency, please immediately call 911 or go to the emergency department.  Pager Numbers  - Dr. Hester: [phone]  - Dr. Jackquline: [phone]  - Dr. Claudene: [phone]   - Dr. Raymund:  [phone]  In the event of inclement weather, please call our main line at [phone] for an update on the status of any delays or closures.  Dermatology Medication Tips: Please keep the boxes that topical medications come in in order to help keep track of the instructions about where and how to use these. Pharmacies typically print the medication instructions only on the boxes and not directly on the medication tubes.   If your medication is too expensive, please contact our office at [phone] option 4 or send us  a message through MyChart.   We are unable to tell what your co-pay for medications will be in advance as this is different depending on your insurance coverage. However, we may be able to find a substitute medication at lower cost or fill out paperwork to get insurance to cover a needed medication.   If a prior authorization is required to get your medication covered by your insurance company, please allow us  1-2 business days to complete this process.  Drug prices often vary depending on where the prescription is filled and some pharmacies may offer cheaper prices.  The website www.goodrx.com contains coupons for medications through different pharmacies. The prices here do not account for what the cost  may be with help from insurance (it may be cheaper with your insurance), but the website can give you the price if you did not use any insurance.  - You can print the associated coupon and take it with your prescription to the pharmacy.  - You may also stop by our office during regular business hours and pick up a GoodRx coupon card.  - If you need your prescription sent electronically to a different pharmacy, notify our office through [facility] or by phone at [phone] option 4.     Si Usted Necesita Algo Despus de Su Visita  Tambin puede enviarnos un mensaje a travs de Clinical cytogeneticist. Por lo general respondemos a los mensajes de MyChart en el transcurso de 1 a 2 das hbiles.  Para renovar recetas, por favor pida a su farmacia que se ponga en contacto con nuestra oficina. Randi lakes de fax es [place] [phone].  Si tiene un asunto urgente cuando la clnica est cerrada y que no puede esperar hasta el siguiente da hbil, puede llamar/localizar a su doctor(a) al nmero que aparece a continuacin.   Por favor, tenga en cuenta que aunque hacemos todo lo posible para estar disponibles para asuntos urgentes fuera del horario de [place], no estamos disponibles las 24 horas del da, los 7 [address]  [address] de la [place].   Si tiene un problema urgente y no puede comunicarse con nosotros, puede optar por buscar atencin mdica  en el consultorio de su doctor(a), en una clnica privada, en un centro de atencin urgente o en una sala de emergencias.  Si tiene Engineer, drilling, por favor llame inmediatamente al 911 o vaya a la sala de emergencias.  Nmeros de bper  - Dr. Hester: [phone]  - Dra. Jackquline: [phone]  - Dr. Claudene: [phone]  - Dra. Kitts: [phone]  En caso de inclemencias del [place], por favor llame a nuestra lnea principal al [phone] para una actualizacin sobre el estado de cualquier retraso o cierre.  Consejos para la medicacin en dermatologa: Por  favor, guarde las cajas en las que vienen los medicamentos de uso tpico para ayudarle a seguir las instrucciones sobre dnde y cmo usarlos. Las farmacias generalmente imprimen las instrucciones del medicamento slo en las cajas y no directamente  en los tubos del medicamento.   Si su medicamento es muy caro, por favor, pngase en contacto con landry rieger llamando al [phone] y presione la opcin 4 o envenos un mensaje a travs de Clinical cytogeneticist.   No podemos decirle cul ser su copago por los medicamentos por adelantado ya que esto es diferente dependiendo de la cobertura de su seguro. Sin embargo, es posible que podamos encontrar un medicamento sustituto a Audiological scientist un formulario para que el seguro cubra el medicamento que se considera necesario.   Si se requiere una autorizacin previa para que su compaa de seguros malta su medicamento, por favor permtanos de 1 a 2 das hbiles para completar este proceso.  Los precios de los medicamentos varan con frecuencia dependiendo del Environmental consultant de dnde se surte la receta y alguna farmacias pueden ofrecer precios ms baratos.  El sitio web www.goodrx.com tiene cupones para medicamentos de Health and safety inspector. Los precios aqu no tienen en cuenta lo que podra costar con la ayuda del seguro (puede ser ms barato con su seguro), pero el sitio web puede darle el precio si no utiliz Tourist information centre manager.  - Puede imprimir el cupn correspondiente y llevarlo con su receta a la farmacia.  - Tambin puede pasar por nuestra oficina durante el horario de atencin regular y Education officer, museum una tarjeta de cupones de GoodRx.  - Si necesita que su receta se enve electrnicamente a una farmacia diferente, informe a nuestra oficina a travs de MyChart de [place] o por telfono llamando al [phone] y presione la opcin 4.

## 2024-04-30 ENCOUNTER — Encounter: Payer: Self-pay | Admitting: Dermatology

## 2024-05-04 LAB — SURGICAL PATHOLOGY

## 2024-05-05 ENCOUNTER — Encounter: Payer: Self-pay | Admitting: Dermatology

## 2024-05-05 ENCOUNTER — Ambulatory Visit: Payer: Self-pay | Admitting: Dermatology

## 2024-05-05 NOTE — Telephone Encounter (Addendum)
 Called and discussed bx results with patient. She verbalized understanding and denied further questions.    ----- Message from Alm Rhyme sent at 05/05/2024 11:40 AM EST ----- FINAL DIAGNOSIS        1. Skin, right mid lateral pretibial :       SUPERFICIAL BASAL CELL CARCINOMA, LIMITED MARGINS FREE    Cancer = BCC Superficial Margins free, but may need further treatment. Recheck next visit ----- Message ----- From: Interface, Lab In Three Zero One Sent: 05/04/2024   8:25 PM EST To: Alm JAYSON Rhyme, MD

## 2024-05-05 NOTE — Telephone Encounter (Signed)
-----   Message from Alm Rhyme sent at 05/05/2024 11:40 AM EST ----- FINAL DIAGNOSIS        1. Skin, right mid lateral pretibial :       SUPERFICIAL BASAL CELL CARCINOMA, LIMITED MARGINS FREE    Cancer = BCC Superficial Margins free, but may need further treatment. Recheck next visit ----- Message ----- From: Interface, Lab In Three Zero One Sent: 05/04/2024   8:25 PM EST To: Alm JAYSON Rhyme, MD

## 2024-05-08 NOTE — Progress Notes (Signed)
 Patient's last menstrual period was 08/09/2023. Estimated Date of Delivery: 05/15/24  28 y.o. G1P0000 at [redacted]w[redacted]d  The primary encounter diagnosis was Encounter for supervision of normal first pregnancy in first trimester (HHS-HCC). Diagnoses of B12 deficiency and Palpitations were also pertinent to this visit.  S:   Patient concerns today:  - Hemorrhoids - reassurance given   Chief Complaint  Patient presents with  . Routine Prenatal Visit    Reports: see above  Denies bleeding, contractions, cramping or leaking.   O:   See [facility] flowsheets. BP 119/77   Pulse 102   Ht 175.3 cm (5' 9.02)   Wt 93 kg (205 lb)   LMP 08/09/2023   BMI 30.26 kg/m  Gen: NAD  Pulm: No use of accessory muscles, normal respirations Abdomen: Gravid, nontender  Fundal height: 39cm  FHT by doppler (distinguished from mom): 140bpm  S=D Pelvic: SVE deferred Ext : no edema, no rashes Psych: Mood, insight, judgement intact  A/P:  28 y.o. G1P0000 at [redacted]w[redacted]d   Palpitations - (noted prior to pregnancy as well)  Following with cardiology- s/p Holter monitor which was reassuring Vitamin B12 deficiency- B12 on 286 on 03/03/24 Taking oral B12. Discussed option of B12 injections and declines.   - Problem list reviewed and/or updated. - GBS results: Neg - Labor plans reviewed: Plan to IOL around or just after 41 weeks - Return in about 1 week (around 05/15/2024) for Routine OB visit.    Attestation Statement:   I personally performed the service, non-incident to. [place])   JENNIFER RICHARDSON MYRON DELON MYRON, CNM 05/08/2024 12:24 PM

## 2024-05-17 ENCOUNTER — Observation Stay
Admission: EM | Admit: 2024-05-17 | Discharge: 2024-05-17 | Disposition: A | Discharge status: Home / Self Care | Attending: Obstetrics and Gynecology | Admitting: Obstetrics and Gynecology

## 2024-05-17 ENCOUNTER — Encounter: Payer: Self-pay | Admitting: Obstetrics and Gynecology

## 2024-05-17 DIAGNOSIS — Z3A40 40 weeks gestation of pregnancy: Secondary | ICD-10-CM | POA: Insufficient documentation

## 2024-05-17 DIAGNOSIS — O99413 Diseases of the circulatory system complicating pregnancy, third trimester: Secondary | ICD-10-CM | POA: Insufficient documentation

## 2024-05-17 DIAGNOSIS — O3443 Maternal care for other abnormalities of cervix, third trimester: Principal | ICD-10-CM | POA: Insufficient documentation

## 2024-05-17 DIAGNOSIS — O99283 Endocrine, nutritional and metabolic diseases complicating pregnancy, third trimester: Secondary | ICD-10-CM | POA: Insufficient documentation

## 2024-05-17 DIAGNOSIS — O479 False labor, unspecified: Principal | ICD-10-CM | POA: Diagnosis present

## 2024-05-17 NOTE — OB Triage Note (Signed)
 Pt Roberta Gonzales 28 y.o. presents to labor and delivery triage reporting contractions that began around 1am this morning but became stronger and more regular aroun 7pm.  . Pt is a G1P0 at 40 . Pt denies signs and symptons consistent with rupture of membranes or active vaginal bleeding. Pt denies contractions and states positive fetal movement. External FM and TOCO applied to non-tender abdomen and assessing. Initial FHR 145 . Vital signs obtained and within normal limits. Provider notified of pt.

## 2024-05-17 NOTE — Discharge Summary (Signed)
 Patient ID: Roberta Gonzales MRN: [id number] DOB/AGE: July 03, 1995 28 y.o.  Admit date: 05/17/2024 Discharge date: 05/17/2024  Admission Diagnoses: 28yo G1P0 at [redacted]w[redacted]d presents with uterine contractions.  Discharge Diagnoses: Early labor  Factors complicating pregnancy: Palpitations - (noted prior to pregnancy as well)  Vitamin B12 deficiency- B12 on 286 on 03/03/24   Prenatal Procedures: NST  Consults: None  Significant Diagnostic Studies:  No results found for this or any previous visit (from the past week).  Treatments: none  Hospital Course:  This is a 28 y.o. G1P0000 with IUP at [redacted]w[redacted]d seen for a labor check, noted to have a cervical exam of 2/60/-1.  No leaking of fluid and no bleeding.  She was observed, fetal heart rate monitoring remained reassuring, and she had no signs/symptoms of active labor or other maternal-fetal concerns.  Her cervical exam was unchanged from admission.  She was deemed stable for discharge to home with outpatient follow up.  Discharge Physical Exam:  BP 131/88   Pulse (!) 113   Temp 98.4 F (36.9 C) (Oral)   Resp 18   LMP 08/08/2023 (Exact Date)   General: NAD CV: RRR Pulm: nl effort ABD: s/nd/nt, gravid DVT Evaluation: LE non-ttp, no evidence of DVT on exam.  NST: FHR baseline: 145 bpm Variability: moderate Accelerations: yes Decelerations: none Category/reactivity: reactive  TOCO: 8-10 min SVE:  Dilation: 2 Effacement (%): 60 Station: -1 Exam by:: A. Rowland,RN   Discharge Condition: Stable  Disposition: Discharge disposition: 01-Home or Self Care        Allergies as of 05/17/2024   No Known Allergies      Medication List     STOP taking these medications    norethindrone -ethinyl estradiol 1-20 MG-MCG tablet Commonly known as: LOESTRIN   spironolactone  25 MG tablet Commonly known as: ALDACTONE         Follow-up Information     [facility] Follow up.   Why: Keep all scheduled  appointments Contact information: [address] [place]  [address] [phone]                Signed:  DELON COE, CNM 05/17/2024 11:09 PM

## 2024-05-18 ENCOUNTER — Inpatient Hospital Stay: Admitting: Anesthesiology

## 2024-05-18 ENCOUNTER — Encounter: Payer: Self-pay | Admitting: Obstetrics and Gynecology

## 2024-05-18 ENCOUNTER — Other Ambulatory Visit: Payer: Self-pay

## 2024-05-18 ENCOUNTER — Inpatient Hospital Stay
Admission: EM | Admit: 2024-05-18 | Discharge: 2024-05-20 | DRG: 806 | Disposition: A | Attending: Obstetrics | Admitting: Obstetrics

## 2024-05-18 DIAGNOSIS — D62 Acute posthemorrhagic anemia: Secondary | ICD-10-CM | POA: Diagnosis not present

## 2024-05-18 DIAGNOSIS — O480 Post-term pregnancy: Principal | ICD-10-CM | POA: Diagnosis present

## 2024-05-18 DIAGNOSIS — O9962 Diseases of the digestive system complicating childbirth: Secondary | ICD-10-CM | POA: Diagnosis present

## 2024-05-18 DIAGNOSIS — O9081 Anemia of the puerperium: Secondary | ICD-10-CM | POA: Diagnosis not present

## 2024-05-18 DIAGNOSIS — Z85828 Personal history of other malignant neoplasm of skin: Secondary | ICD-10-CM

## 2024-05-18 DIAGNOSIS — K219 Gastro-esophageal reflux disease without esophagitis: Secondary | ICD-10-CM | POA: Diagnosis present

## 2024-05-18 DIAGNOSIS — Z3A40 40 weeks gestation of pregnancy: Secondary | ICD-10-CM

## 2024-05-18 LAB — ABO/RH: ABO/RH(D): O POS

## 2024-05-18 LAB — CBC
HCT: 36.3 % (ref 36.0–46.0)
Hemoglobin: 11.9 g/dL — ABNORMAL LOW (ref 12.0–15.0)
MCH: 27.2 pg (ref 26.0–34.0)
MCHC: 32.8 g/dL (ref 30.0–36.0)
MCV: 83.1 fL (ref 80.0–100.0)
Platelets: 243 K/uL (ref 150–400)
RBC: 4.37 MIL/uL (ref 3.87–5.11)
RDW: 13.1 % (ref 11.5–15.5)
WBC: 16.1 K/uL — ABNORMAL HIGH (ref 4.0–10.5)
nRBC: 0 % (ref 0.0–0.2)

## 2024-05-18 LAB — TYPE AND SCREEN
ABO/RH(D): O POS
Antibody Screen: NEGATIVE

## 2024-05-18 LAB — SYPHILIS: RPR W/REFLEX TO RPR TITER AND TREPONEMAL ANTIBODIES, TRADITIONAL SCREENING AND DIAGNOSIS ALGORITHM: RPR Ser Ql: NONREACTIVE

## 2024-05-18 MED ORDER — PHENYLEPHRINE 80 MCG/ML (10ML) SYRINGE FOR IV PUSH (FOR BLOOD PRESSURE SUPPORT): 80.0000 ug | PREFILLED_SYRINGE | INTRAVENOUS | Status: DC | PRN

## 2024-05-18 MED ORDER — OXYTOCIN-SODIUM CHLORIDE 30-0.9 UT/500ML-% IV SOLN
2.5000 [IU]/h | INTRAVENOUS | Status: DC
  Administered 2024-05-18: 2.5 [IU]/h via INTRAVENOUS

## 2024-05-18 MED ORDER — FENTANYL-BUPIVACAINE-NACL 0.5-0.125-0.9 MG/250ML-% EP SOLN
12.0000 mL/h | EPIDURAL | Status: DC | PRN
  Administered 2024-05-18: 12 mL/h via EPIDURAL

## 2024-05-18 MED ORDER — IBUPROFEN 600 MG PO TABS
600.0000 mg | ORAL_TABLET | Freq: Four times a day (QID) | ORAL | Status: DC
  Administered 2024-05-18 – 2024-05-20 (×7): 600 mg via ORAL
  Filled 2024-05-18 (×6): qty 1

## 2024-05-18 MED ORDER — DIPHENHYDRAMINE HCL 25 MG PO CAPS: 25.0000 mg | ORAL_CAPSULE | Freq: Four times a day (QID) | ORAL | Status: DC | PRN

## 2024-05-18 MED ORDER — ACETAMINOPHEN 325 MG PO TABS: 650.0000 mg | ORAL_TABLET | ORAL | Status: DC | PRN

## 2024-05-18 MED ORDER — PRENATAL MULTIVITAMIN CH
1.0000 | ORAL_TABLET | Freq: Every day | ORAL | Status: DC
  Administered 2024-05-19 – 2024-05-20 (×2): 1 via ORAL
  Filled 2024-05-18 (×3): qty 1

## 2024-05-18 MED ORDER — LIDOCAINE HCL (PF) 1 % IJ SOLN: 30.0000 mL | INTRAMUSCULAR | Status: DC | PRN

## 2024-05-18 MED ORDER — TERBUTALINE SULFATE 1 MG/ML IJ SOLN: 0.2500 mg | Freq: Once | INTRAMUSCULAR | Status: DC | PRN

## 2024-05-18 MED ORDER — OXYTOCIN 10 UNIT/ML IJ SOLN
INTRAMUSCULAR | Status: DC
  Filled 2024-05-18: qty 2

## 2024-05-18 MED ORDER — TRANEXAMIC ACID-NACL 1000-0.7 MG/100ML-% IV SOLN
INTRAVENOUS | Status: AC
  Filled 2024-05-18: qty 100

## 2024-05-18 MED ORDER — FAMOTIDINE 40 MG/5ML PO SUSR
40.0000 mg | Freq: Two times a day (BID) | ORAL | Status: DC
  Filled 2024-05-18: qty 5

## 2024-05-18 MED ORDER — ONDANSETRON HCL 4 MG/2ML IJ SOLN: 4.0000 mg | Freq: Four times a day (QID) | INTRAMUSCULAR | Status: DC | PRN

## 2024-05-18 MED ORDER — EPHEDRINE 5 MG/ML INJ: 10.0000 mg | INTRAVENOUS | Status: DC | PRN

## 2024-05-18 MED ORDER — TRANEXAMIC ACID-NACL 1000-0.7 MG/100ML-% IV SOLN
1000.0000 mg | INTRAVENOUS | Status: AC
  Administered 2024-05-18: 1000 mg via INTRAVENOUS

## 2024-05-18 MED ORDER — FERROUS SULFATE 325 (65 FE) MG PO TABS
325.0000 mg | ORAL_TABLET | Freq: Two times a day (BID) | ORAL | Status: DC
  Administered 2024-05-19 – 2024-05-20 (×3): 325 mg via ORAL
  Filled 2024-05-18 (×3): qty 1

## 2024-05-18 MED ORDER — SODIUM CHLORIDE 0.9 % IV SOLN
INTRAVENOUS | Status: DC | PRN
  Administered 2024-05-18 (×2): 5 mL via EPIDURAL

## 2024-05-18 MED ORDER — LACTATED RINGERS IV SOLN
500.0000 mL | INTRAVENOUS | Status: DC | PRN
  Administered 2024-05-18: 500 mL via INTRAVENOUS

## 2024-05-18 MED ORDER — SENNOSIDES-DOCUSATE SODIUM 8.6-50 MG PO TABS: 2.0000 | ORAL_TABLET | ORAL | Status: DC

## 2024-05-18 MED ORDER — OXYCODONE HCL 5 MG PO TABS: 5.0000 mg | ORAL_TABLET | ORAL | Status: DC | PRN

## 2024-05-18 MED ORDER — LIDOCAINE-EPINEPHRINE (PF) 1.5 %-1:200000 IJ SOLN
INTRAMUSCULAR | Status: DC | PRN
  Administered 2024-05-18: 3 mL via EPIDURAL

## 2024-05-18 MED ORDER — SOD CITRATE-CITRIC ACID 500-334 MG/5ML PO SOLN: 30.0000 mL | ORAL | Status: DC | PRN

## 2024-05-18 MED ORDER — MISOPROSTOL 200 MCG PO TABS
ORAL_TABLET | ORAL | Status: DC
  Filled 2024-05-18: qty 4

## 2024-05-18 MED ORDER — DIPHENHYDRAMINE HCL 50 MG/ML IJ SOLN: 12.5000 mg | INTRAMUSCULAR | Status: DC | PRN

## 2024-05-18 MED ORDER — FAMOTIDINE 20 MG PO TABS
40.0000 mg | ORAL_TABLET | Freq: Two times a day (BID) | ORAL | Status: DC
  Administered 2024-05-18: 40 mg via ORAL
  Filled 2024-05-18: qty 2

## 2024-05-18 MED ORDER — LACTATED RINGERS IV SOLN: INTRAVENOUS | Status: DC

## 2024-05-18 MED ORDER — LIDOCAINE HCL (PF) 1 % IJ SOLN
INTRAMUSCULAR | Status: AC
  Filled 2024-05-18: qty 30

## 2024-05-18 MED ORDER — FENTANYL-BUPIVACAINE-NACL 0.5-0.125-0.9 MG/250ML-% EP SOLN
EPIDURAL | Status: AC
  Filled 2024-05-18: qty 250

## 2024-05-18 MED ORDER — SODIUM CHLORIDE 0.9% FLUSH: 3.0000 mL | Freq: Two times a day (BID) | INTRAVENOUS | Status: DC

## 2024-05-18 MED ORDER — SENNOSIDES-DOCUSATE SODIUM 8.6-50 MG PO TABS
2.0000 | ORAL_TABLET | ORAL | Status: DC
  Administered 2024-05-19 – 2024-05-20 (×2): 2 via ORAL
  Filled 2024-05-18 (×2): qty 2

## 2024-05-18 MED ORDER — COCONUT OIL OIL: 1.0000 | TOPICAL_OIL | Status: DC | PRN

## 2024-05-18 MED ORDER — ONDANSETRON HCL 4 MG/2ML IJ SOLN: 4.0000 mg | INTRAMUSCULAR | Status: DC | PRN

## 2024-05-18 MED ORDER — LIDOCAINE HCL (PF) 1 % IJ SOLN
INTRAMUSCULAR | Status: DC | PRN
  Administered 2024-05-18: 3 mL via SUBCUTANEOUS

## 2024-05-18 MED ORDER — FLEET ENEMA RE ENEM: 1.0000 | ENEMA | Freq: Every day | RECTAL | Status: DC | PRN

## 2024-05-18 MED ORDER — FENTANYL CITRATE (PF) 100 MCG/2ML IJ SOLN
50.0000 ug | INTRAMUSCULAR | Status: DC | PRN
  Administered 2024-05-18: 100 ug via INTRAVENOUS
  Filled 2024-05-18: qty 2

## 2024-05-18 MED ORDER — OXYTOCIN-SODIUM CHLORIDE 30-0.9 UT/500ML-% IV SOLN
1.0000 m[IU]/min | INTRAVENOUS | Status: DC
  Administered 2024-05-18: 2 m[IU]/min via INTRAVENOUS
  Filled 2024-05-18: qty 500

## 2024-05-18 MED ORDER — BISACODYL 10 MG RE SUPP: 10.0000 mg | Freq: Every day | RECTAL | Status: DC | PRN

## 2024-05-18 MED ORDER — SODIUM CHLORIDE 0.9% FLUSH: 3.0000 mL | INTRAVENOUS | Status: DC | PRN

## 2024-05-18 MED ORDER — LACTATED RINGERS IV SOLN
500.0000 mL | Freq: Once | INTRAVENOUS | Status: AC
  Administered 2024-05-18: 500 mL via INTRAVENOUS

## 2024-05-18 MED ORDER — AMMONIA AROMATIC IN INHA
RESPIRATORY_TRACT | Status: DC
  Filled 2024-05-18: qty 10

## 2024-05-18 MED ORDER — WITCH HAZEL-GLYCERIN EX PADS
1.0000 | MEDICATED_PAD | CUTANEOUS | Status: DC | PRN
  Administered 2024-05-18: 1 via TOPICAL
  Filled 2024-05-18: qty 100

## 2024-05-18 MED ORDER — SODIUM CHLORIDE 0.9 % IV SOLN: 250.0000 mL | INTRAVENOUS | Status: DC | PRN

## 2024-05-18 MED ORDER — OXYCODONE-ACETAMINOPHEN 5-325 MG PO TABS: 2.0000 | ORAL_TABLET | ORAL | Status: DC | PRN

## 2024-05-18 MED ORDER — OXYTOCIN BOLUS FROM INFUSION
333.0000 mL | Freq: Once | INTRAVENOUS | Status: AC
  Administered 2024-05-18: 333 mL via INTRAVENOUS

## 2024-05-18 MED ORDER — ONDANSETRON HCL 4 MG PO TABS: 4.0000 mg | ORAL_TABLET | ORAL | Status: DC | PRN

## 2024-05-18 MED ORDER — SIMETHICONE 80 MG PO CHEW: 80.0000 mg | CHEWABLE_TABLET | ORAL | Status: DC | PRN

## 2024-05-18 MED ORDER — ZOLPIDEM TARTRATE 5 MG PO TABS: 5.0000 mg | ORAL_TABLET | Freq: Every evening | ORAL | Status: DC | PRN

## 2024-05-18 MED ORDER — BENZOCAINE-MENTHOL 20-0.5 % EX AERO
1.0000 | INHALATION_SPRAY | CUTANEOUS | Status: DC | PRN
  Administered 2024-05-18: 1 via TOPICAL
  Filled 2024-05-18: qty 56

## 2024-05-18 MED ORDER — DIBUCAINE (PERIANAL) 1 % EX OINT
1.0000 | TOPICAL_OINTMENT | CUTANEOUS | Status: DC | PRN
  Administered 2024-05-18: 1 via RECTAL
  Filled 2024-05-18: qty 28

## 2024-05-18 MED ORDER — ACETAMINOPHEN 325 MG PO TABS
650.0000 mg | ORAL_TABLET | ORAL | Status: DC | PRN
  Administered 2024-05-18: 650 mg via ORAL

## 2024-05-18 MED ORDER — OXYCODONE-ACETAMINOPHEN 5-325 MG PO TABS: 1.0000 | ORAL_TABLET | ORAL | Status: DC | PRN

## 2024-05-18 NOTE — Progress Notes (Signed)
 Labor Progress Note  Roberta Gonzales is a 28 y.o. G1P0000 at [redacted]w[redacted]d by LMP admitted for active labor  Subjective: pt is comfortable with epidural  Objective: BP 115/70   Pulse 96   Temp 98.3 F (36.8 C) (Oral)   Resp 17   Ht 5' 9 (1.753 m)   Wt 93 kg   LMP 08/08/2023 (Exact Date)   SpO2 96%   BMI 30.27 kg/m   Fetal Assessment: FHT:  FHR: 135 bpm, variability: moderate,  accelerations:  Present,  decelerations:  Absent Category/reactivity:  Category I UC:   regular, every 3-8 minutes SVE:    Dilation: 5cm  Effacement: 90%  Station:  -1  Consistency: ---  Position: ---  Membrane status: Intact Amniotic color: n/a  Labs: Lab Results  Component Value Date   WBC 16.1 (H) 05/18/2024   HGB 11.9 (L) 05/18/2024   HCT 36.3 05/18/2024   MCV 83.1 05/18/2024   PLT 243 05/18/2024    Assessment / Plan: Spontaneous labor, progressing normally  Labor: Progressing normally Preeclampsia:  115/70 Fetal Wellbeing:  Category I Pain Control:  Epidural I/D:  Afebrile, GBS neg, Intact Anticipated MOD:  NSVD  Jenifer E Naleigha Raimondi, CNM 05/18/2024, 6:04 AM

## 2024-05-18 NOTE — Progress Notes (Signed)
 L&D Note    Subjective:  Resting in bed with family at bedside  Objective:    Current Vital Signs 24h Vital Sign Ranges  T 98.9 F (37.2 C) Temp  Avg: 98.4 F (36.9 C)  Min: 97.8 F (36.6 C)  Max: 98.9 F (37.2 C)  BP 122/82 BP  Min: 101/53  Max: 149/86  HR (!) 126 Pulse  Avg: 108.6  Min: 96  Max: 126  RR 18 Resp  Avg: 17.7  Min: 17  Max: 18  SaO2 98 %   SpO2  Avg: 97.4 %  Min: 94 %  Max: 100 %       Gen: alert, cooperative, no distress FHR: Baseline: 145 bpm, Variability: moderate, Accels: Present, Decels: intermittent small variables Toco: regular, every 2-3 minutes SVE: Dilation: 8.5 Effacement (%): 80 Station: 0 Presentation: Vertex Exam by:: D Sickles RN  Medications SCHEDULED MEDICATIONS   ammonia       famotidine  40 mg Oral BID   lidocaine (PF)       misoprostol       oxytocin       oxytocin 40 units in LR 1000 mL  333 mL Intravenous Once    MEDICATION INFUSIONS   fentaNYL 2 mcg/mL w/bupivacaine 0.125% in NS 250 mL 12 mL/hr (05/18/24 0410)   lactated ringers 500 mL (05/18/24 0837)   lactated ringers 125 mL/hr at 05/18/24 9357   oxytocin     oxytocin 2 milli-units/min (05/18/24 1441)    PRN MEDICATIONS  acetaminophen, ammonia, diphenhydrAMINE, ePHEDrine, ePHEDrine, fentaNYL (SUBLIMAZE) injection, fentaNYL 2 mcg/mL w/bupivacaine 0.125% in NS 250 mL, lactated ringers, lidocaine (PF), lidocaine (PF), misoprostol, ondansetron, oxyCODONE-acetaminophen, oxyCODONE-acetaminophen, oxytocin, phenylephrine, phenylephrine, sodium citrate-citric acid, terbutaline   Assessment & Plan:  28 y.o. G1P0000 at [redacted]w[redacted]d admitted for active labor -GBS: negative -IP Antibiotics: abx: none -Membranes ruptured, clear fluid 1149 -Recheck:Not evaluated. -Preeclampsia:  n/a -Pain: lower left side, PCA button used -Intervention: change maternal position and anticipate vaginal delivery -Pe_uterus_labor: Adequate relaxation between contractions. -Analgesia: regional  anesthesia   Serrina Minogue, CNM  05/18/2024 4:54 PM  [facility]

## 2024-05-18 NOTE — Anesthesia Preprocedure Evaluation (Signed)
 Anesthesia Evaluation  Patient identified by MRN, date of birth, ID band Patient awake    Reviewed: Allergy & Precautions, H&P , NPO status , Patient's Chart, lab work & pertinent test results, reviewed documented beta blocker date and time   History of Anesthesia Complications Negative for: history of anesthetic complications  Airway Mallampati: II  TM Distance: >3 FB Neck ROM: full    Dental no notable dental hx.    Pulmonary neg pulmonary ROS   Pulmonary exam normal breath sounds clear to auscultation       Cardiovascular Exercise Tolerance: Good negative cardio ROS Normal cardiovascular exam Rhythm:regular Rate:Normal     Neuro/Psych negative neurological ROS  negative psych ROS   GI/Hepatic Neg liver ROS,GERD  ,,  Endo/Other  negative endocrine ROS    Renal/GU negative Renal ROS  negative genitourinary   Musculoskeletal   Abdominal   Peds  Hematology negative hematology ROS (+)   Anesthesia Other Findings Past Medical History: 04/29/2024: Basal cell carcinoma     Comment:  Right mid lateral pretibial - superficial - margins free              but may need further treatment recheck at next followup No date: Traumatic rupture of ulnar collateral ligament of elbow   Reproductive/Obstetrics (+) Pregnancy                              Anesthesia Physical Anesthesia Plan  ASA: 2  Anesthesia Plan: Epidural   Post-op Pain Management:    Induction:   PONV Risk Score and Plan:   Airway Management Planned:   Additional Equipment:   Intra-op Plan:   Post-operative Plan:   Informed Consent: I have reviewed the patients History and Physical, chart, labs and discussed the procedure including the risks, benefits and alternatives for the proposed anesthesia with the patient or authorized representative who has indicated his/her understanding and acceptance.     Dental Advisory  Given  Plan Discussed with: Anesthesiologist, CRNA and Surgeon  Anesthesia Plan Comments:          Anesthesia Quick Evaluation

## 2024-05-18 NOTE — Anesthesia Procedure Notes (Signed)
 Epidural Patient location during procedure: OB Start time: 05/18/2024 3:53 AM End time: 05/18/2024 3:56 AM  Staffing Anesthesiologist: Dario Barter, MD Performed: anesthesiologist   Preanesthetic Checklist Completed: patient identified, IV checked, site marked, risks and benefits discussed, surgical consent, monitors and equipment checked, pre-op evaluation and timeout performed  Epidural Patient position: sitting Prep: ChloraPrep Patient monitoring: heart rate, continuous pulse ox and blood pressure Approach: midline Location: L3-L4 Injection technique: LOR saline  Needle:  Needle insertion depth: 5.5 cm Needle type: Tuohy  Needle gauge: 17 G Needle length: 9 cm Needle insertion depth: 5.5 cm Catheter type: closed end flexible Catheter size: 19 Gauge Catheter at skin depth: 10.5 cm Test dose: negative and 1.5% lidocaine with Epi 1:200 K  Assessment Sensory level: T10 Events: blood not aspirated, no cerebrospinal fluid, injection not painful, no injection resistance, no paresthesia and negative IV test  Additional Notes 1st attempt Pt. Evaluated and documentation done after procedure finished. Patient identified. Risks/Benefits/Options discussed with patient including but not limited to bleeding, infection, nerve damage, paralysis, failed block, incomplete pain control, headache, blood pressure changes, nausea, vomiting, reactions to medication both or allergic, itching and postpartum back pain. Confirmed with bedside nurse the patient's most recent platelet count. Confirmed with patient that they are not currently taking any anticoagulation, have any bleeding history or any family history of bleeding disorders. Patient expressed understanding and wished to proceed. All questions were answered. Sterile technique was used throughout the entire procedure. Please see nursing notes for vital signs. Test dose was given through epidural catheter and negative prior to continuing to  dose epidural or start infusion. Warning signs of high block given to the patient including shortness of breath, tingling/numbness in hands, complete motor block, or any concerning symptoms with instructions to call for help. Patient was given instructions on fall risk and not to get out of bed. All questions and concerns addressed with instructions to call with any issues or inadequate analgesia.    Patient tolerated the insertion well without immediate complications.Reason for block:procedure for pain

## 2024-05-18 NOTE — Progress Notes (Signed)
 L&D Note    Subjective:  Resting in bed comfortably with epidural  Objective:    Current Vital Signs 24h Vital Sign Ranges  T 98.8 F (37.1 C) Temp  Avg: 98.3 F (36.8 C)  Min: 97.8 F (36.6 C)  Max: 98.8 F (37.1 C)  BP (!) 140/81 BP  Min: 101/53  Max: 149/86  HR (!) 112 Pulse  Avg: 104.9  Min: 96  Max: 113  RR 18 Resp  Avg: 17.7  Min: 17  Max: 18  SaO2 96 %   SpO2  Avg: 96.7 %  Min: 94 %  Max: 99 %       Gen: alert, cooperative, no distress FHR: Baseline: 135 bpm, Variability: moderate, Accels: Present, Decels: none Toco: regular, every 3-5 minutes SVE: Dilation: 6.5 Effacement (%): 80 Station: -1 Presentation: Vertex Exam by:: Aisha, CNM  Medications SCHEDULED MEDICATIONS   famotidine  40 mg Oral BID   oxytocin 40 units in LR 1000 mL  333 mL Intravenous Once    MEDICATION INFUSIONS   fentaNYL 2 mcg/mL w/bupivacaine 0.125% in NS 250 mL 12 mL/hr (05/18/24 0410)   lactated ringers 500 mL (05/18/24 0837)   lactated ringers 125 mL/hr at 05/18/24 9357   oxytocin      PRN MEDICATIONS  acetaminophen, diphenhydrAMINE, ePHEDrine, ePHEDrine, fentaNYL (SUBLIMAZE) injection, fentaNYL 2 mcg/mL w/bupivacaine 0.125% in NS 250 mL, lactated ringers, lidocaine (PF), ondansetron, oxyCODONE-acetaminophen, oxyCODONE-acetaminophen, phenylephrine, phenylephrine, sodium citrate-citric acid   Assessment & Plan:  28 y.o. G1P0000 at [redacted]w[redacted]d admitted for active labor -GBS: negative -IP Antibiotics: abx: none -Membranes ruptured, clear fluid -Recheck:Evaluated by digital exam. -Preeclampsia:  n/a -Pain: none -Intervention: change maternal position, anticipate vaginal delivery, and AROM -Pe_uterus_labor: Adequate relaxation between contractions. -Analgesia: regional anesthesia   Tylisa Alcivar, CNM  05/18/2024 11:53 AM  [facility]

## 2024-05-18 NOTE — Discharge Summary (Shared)
 Postpartum Discharge Summary  Patient Roberta Gonzales: Roberta Roberta Gonzales DOB: July 26, 1995 MRN: [id number]  Date of admission: 05/18/2024 Delivery date:05/18/2024 Delivering provider: Vishal Sandlin Date of discharge: 05/20/2024  Primary OB: [facility] OFE:Ejupzwu'd last menstrual period was 08/08/2023 (exact date). EDC Estimated Date of Delivery: 05/14/24 Gestational Age at Delivery: [redacted]w[redacted]d   Admitting diagnosis: Normal labor [O80, Z37.9] Intrauterine pregnancy: [redacted]w[redacted]d     Secondary diagnosis:   Principal Problem:   NSVD (normal spontaneous vaginal delivery) Active Problems:   Normal labor   Discharge Diagnosis: Term Pregnancy Delivered and Anemia      Hospital course: Onset of Labor With Vaginal Delivery      28 y.o. yo G1P1001 at [redacted]w[redacted]d was admitted in Latent Labor on 05/18/2024. Labor course was complicated by an intermittent category 2 FHT  Membrane Rupture Time/Date: 11:49 AM,05/18/2024  Delivery Method:Vaginal, Spontaneous Operative Delivery:N/A Episiotomy: None Lacerations:  Vaginal;Periurethral;Labial;1st degree;2nd degree Patient had a postpartum course complicated by none.  She is ambulating, tolerating a regular diet, passing flatus, and urinating well. Patient is discharged home in stable condition on 05/20/24.  Newborn Data: Birth date:05/18/2024 Birth time:8:29 PM Gender:Female Living status:Living Apgars:8 ,9  Weight:4120 g                                            Post partum procedures:none Augmentation:: AROM and Pitocin Complications: None Delivery Type: spontaneous vaginal delivery Anesthesia: IV narcotics, epidural anesthesia Placenta: spontaneous To Pathology: No   Prenatal Labs:   Blood type/Rh O pos  Antibody screen neg  Rubella Immune  Varicella Immune  RPR NR  HBsAg Neg  HIV NR  GC neg  Chlamydia neg  Genetic screening negative  1 hour GTT 104  3 hour GTT    GBS neg     Magnesium Sulfate received: No BMZ received:  No Rhophylac:was not indicated MMR: was not indicated Varivax vaccine given: was not indicated Tdap vaccine: declined Flu vaccine: declined RSV vaccine:declined  Transfusion:No  Physical exam  Vitals:   05/19/24 1115 05/19/24 1532 05/19/24 2254 05/20/24 0813  BP: 129/79 125/72 118/77 119/65  Pulse: 99 95 95 84  Resp: 19 18 18 18   Temp: 98.4 F (36.9 C) 98.2 F (36.8 C) 98.3 F (36.8 C) 97.7 F (36.5 C)  TempSrc: Oral Oral Oral Oral  SpO2:  100% 100% 100%  Weight:      Height:       General: alert and cooperative Lochia: appropriate Uterine Fundus: firm Perineum:minimal edema/repair well approximated DVT Evaluation: No evidence of DVT seen on physical exam.  Labs: Lab Results  Component Value Date   WBC 19.8 (H) 05/19/2024   HGB 9.6 (L) 05/19/2024   HCT 29.4 (L) 05/19/2024   MCV 84.2 05/19/2024   PLT 189 05/19/2024      Latest Ref Rng & Units 11/11/2013    6:28 PM  CMP  Glucose 65 - 99 mg/dL 58   BUN 9 - 21 mg/dL 8   Creatinine 9.39 - 8.69 mg/dL 9.26   Sodium 867 - 858 mmol/L 138   Potassium 3.3 - 4.7 mmol/L 3.5   Chloride 97 - 107 mmol/L 106   CO2 16 - 25 mmol/L 28   Calcium 9.3 - 10.7 mg/dL 9.1    Edinburgh Score:    05/19/2024    1:05 PM  Edinburgh Postnatal Depression Scale Screening Tool  I have been able  to laugh and see the funny side of things. 0  I have looked forward with enjoyment to things. 0  I have blamed myself unnecessarily when things went wrong. 0  I have been anxious or worried for no good reason. 0  I have felt scared or panicky for no good reason. 0  Things have been getting on top of me. 0  I have been so unhappy that I have had difficulty sleeping. 0  I have felt sad or miserable. 0  I have been so unhappy that I have been crying. 0  The thought of harming myself has occurred to me. 0  Edinburgh Postnatal Depression Scale Total 0     Postpartum VTE Prophylaxis  Recommend 6 weeks of prophylactic anticoagulation with LMWH or  subcutaneous unfractionated heparin if 1 or more high risk factor is present.  Recommend 14 days of prophylactic anticoagulation with LMWH or subcutaneous unfractionated heparin if 3 or more moderate risk factors are present.   Risk assessment for postpartum VTE and prophylactic treatment: High risk factors: None Moderate risk factors: None  Postpartum VTE prophylaxis with LMWH not indicated    After visit meds:  Allergies as of 05/20/2024   No Known Allergies      Medication List     TAKE these medications    acetaminophen 325 MG tablet Commonly known as: Tylenol Take 2 tablets (650 mg total) by mouth every 6 (six) hours as needed (for pain scale < 4).   ferrous sulfate 325 (65 FE) MG tablet Take 1 tablet (325 mg total) by mouth every other day.   ibuprofen 600 MG tablet Commonly known as: ADVIL Take 1 tablet (600 mg total) by mouth every 6 (six) hours as needed.       Discharge home in stable condition Infant Feeding: Breast Infant Disposition:home with mother Discharge instruction: per After Visit Summary and Postpartum booklet. Activity: Advance as tolerated. Pelvic rest for 6 weeks.  Diet: routine diet Anticipated Birth Control:  Contraceptives: TBD Postpartum Appointment:6 weeks Additional Postpartum F/U: none Future Appointments: Future Appointments  Date Time Provider Department Center  06/09/2024 10:00 AM Hester Alm BROCKS, MD [facility] None  05/03/2025  4:15 PM Hester Alm BROCKS, MD [facility] None   Follow up Visit:  Follow-up Information     Aisha Heller, CNM Follow up in 6 week(s).   Specialty: Obstetrics Why: postpartum visit Contact information: [address] [place] [address] [phone]                 Plan:  [name] was discharged to home in good condition. Follow-up appointment as directed.    SignedBETHA Margery FORBES Myron  05/20/2024 10:25 AM

## 2024-05-18 NOTE — OB Triage Note (Signed)
 Pt arrived in triage for labor check. Pt was discharged about 3 hours ago. Pt states her UC are now about every 3-minds apart and more intense. Denies ROM.

## 2024-05-18 NOTE — H&P (Signed)
 OB History & Physical   History of Present Illness:  Chief Complaint:   HPI:  Roberta Gonzales is a 28 y.o. G1P0000 female at [redacted]w[redacted]d dated by LMP.  She presents to L&D for active labor  She reports:  -active fetal movement -no leakage of fluid -no vaginal bleeding -contractions, currently every 3-5 minutes  Pregnancy Issues: Palpitations - (noted prior to pregnancy as well)  Vitamin B12 deficiency- B12 on 286 on 03/03/24    Maternal Medical History:   Past Medical History:  Diagnosis Date   Basal cell carcinoma 04/29/2024   Right mid lateral pretibial - superficial - margins free but may need further treatment recheck at next followup   Traumatic rupture of ulnar collateral ligament of elbow     Past Surgical History:  Procedure Laterality Date   EAR TUBE REMOVAL     WISDOM TOOTH EXTRACTION      No Known Allergies  Prior to Admission medications   Not on File     Prenatal care site: [facility]   Social History: She  reports that she has never smoked. She has never used smokeless tobacco. She reports that she does not drink alcohol and does not use drugs.  Family History: family history includes Breast cancer (age of onset: 58) in her maternal grandmother; Cancer (age of onset: 82) in her mother.   Review of Systems: A full review of systems was performed and negative except as noted in the HPI.    Physical Exam:  Vital Signs: BP 115/70   Pulse 96   Temp 98.3 F (36.8 C) (Oral)   Resp 17   Ht 5' 9 (1.753 m)   Wt 93 kg   LMP 08/08/2023 (Exact Date)   SpO2 96%   BMI 30.27 kg/m   General:   alert and cooperative  Skin:  normal  Neurologic:    Alert & oriented x 3  Lungs:   Nl effort  Heart:   regular rate and rhythm  Abdomen:  normal findings: soft, non-tender  Extremities: : non-tender, symmetric, no edema bilaterally.      EFW: 04/02/24 2,746 g with a percentile of 90%   Results for orders placed or performed during the hospital  encounter of 05/18/24 (from the past 24 hours)  CBC     Status: Abnormal   Collection Time: 05/18/24  2:56 AM  Result Value Ref Range   WBC 16.1 (H) 4.0 - 10.5 K/uL   RBC 4.37 3.87 - 5.11 MIL/uL   Hemoglobin 11.9 (L) 12.0 - 15.0 g/dL   HCT 63.6 63.9 - 53.9 %   MCV 83.1 80.0 - 100.0 fL   MCH 27.2 26.0 - 34.0 pg   MCHC 32.8 30.0 - 36.0 g/dL   RDW 86.8 88.4 - 84.4 %   Platelets 243 150 - 400 K/uL   nRBC 0.0 0.0 - 0.2 %  Type and screen [facility]     Status: None   Collection Time: 05/18/24  2:56 AM  Result Value Ref Range   ABO/RH(D) O POS    Antibody Screen NEG    Sample Expiration      05/21/2024,2359 Performed at [facility], [address] [place], [place] [address]     Pertinent Results:  Prenatal Labs: Blood type/Rh O pos  Antibody screen neg  Rubella Immune  Varicella Immune  RPR NR  HBsAg Neg  HIV NR  GC neg  Chlamydia neg  Genetic screening negative  1  hour GTT 104  3 hour GTT   GBS neg   FHT: FHR: 145 bpm, variability: moderate,  accelerations:  Present,  decelerations:  Absent Category/reactivity:  Category I TOCO: regular, every 3-5 minutes  Assessment:  Roberta Gonzales is a 28 y.o. G1P0000 female at [redacted]w[redacted]d with active labor.   Plan:  1. Admit to Labor & Delivery; consents reviewed and obtained  2. Fetal Well being  - Fetal Tracing: Cat I - GBS neg - Presentation: vtx confirmed by sve   3. Routine OB: - Prenatal labs reviewed, as above - Rh pos - CBC & T&S on admit - Clear fluids, IVF  4. Monitoring of Labor -  Contractions by external toco in place -  Plan for continuous fetal monitoring  -  Maternal pain control as desired: IVPM, nitrous, regional anesthesia - Anticipate vaginal delivery  5. Post Partum Planning: - Infant feeding: breastfeeding - Contraception: TBD - Tdap: declined - Flu: declined - RSV: declined  Charo Philipp, CNM 05/18/2024 6:00 AM

## 2024-05-19 ENCOUNTER — Encounter: Payer: Self-pay | Admitting: Obstetrics and Gynecology

## 2024-05-19 LAB — CBC
HCT: 29.4 % — ABNORMAL LOW (ref 36.0–46.0)
Hemoglobin: 9.6 g/dL — ABNORMAL LOW (ref 12.0–15.0)
MCH: 27.5 pg (ref 26.0–34.0)
MCHC: 32.7 g/dL (ref 30.0–36.0)
MCV: 84.2 fL (ref 80.0–100.0)
Platelets: 189 K/uL (ref 150–400)
RBC: 3.49 MIL/uL — ABNORMAL LOW (ref 3.87–5.11)
RDW: 13.2 % (ref 11.5–15.5)
WBC: 19.8 K/uL — ABNORMAL HIGH (ref 4.0–10.5)
nRBC: 0 % (ref 0.0–0.2)

## 2024-05-19 NOTE — Progress Notes (Signed)
 Postpartum Day  1  Subjective: 28 y.o. G1P0000 postpartum day #1 status post normal spontaneous vaginal delivery. She is ambulating, is tolerating po, is voiding spontaneously.  Her pain is well controlled on PO pain medications. Her lochia is less than menses.  Objective: BP 129/79 (BP Location: Right Arm)   Pulse 99   Temp 98.4 F (36.9 C) (Oral)   Resp 19   Ht 5' 9 (1.753 m)   Wt 93 kg   LMP 08/08/2023 (Exact Date)   SpO2 99%   BMI 30.27 kg/m    Physical Exam:  General: alert, cooperative, and no distress Breasts: soft/nontender Pulm: nl effort Abdomen: soft, non-tender, active bowel sounds Uterine Fundus: firm Perineum: minimal edema, intact Lochia: appropriate DVT Evaluation: No evidence of DVT seen on physical exam.  Recent Labs    05/18/24 0256 05/19/24 0327  HGB 11.9* 9.6*  HCT 36.3 29.4*  WBC 16.1* 19.8*  PLT 243 189    Assessment/Plan: 28 y.o. G1P0000 postpartum day # 1  1. Continue routine postpartum care  2. Infant feeding status: breast feeding -Lactation consult PRN for breastfeeding   3. Contraception plan: TBD  4. Acute blood loss anemia - clinically significant.  -Hemodynamically stable and asymptomatic -Intervention: start on oral supplementation with ferrous sulfate 325 mg   5. Immunization status:   all immunizations up to date   Disposition: continue inpatient postpartum care    LOS: 1 day   Therisa CHRISTELLA Pillow, [place] 05/19/2024, 2:04 PM   ----- Therisa Pillow  Certified Nurse Midwife [facility]

## 2024-05-19 NOTE — Anesthesia Postprocedure Evaluation (Signed)
 Anesthesia Post Note  Patient: Roberta Gonzales  Procedure(s) Performed: AN AD HOC LABOR EPIDURAL  Patient location during evaluation: Mother Baby Anesthesia Type: Epidural Level of consciousness: awake Pain management: satisfactory to patient Vital Signs Assessment: post-procedure vital signs reviewed and stable Respiratory status: spontaneous breathing Cardiovascular status: stable Postop Assessment: patient able to bend at knees, no apparent nausea or vomiting, adequate PO intake and able to ambulate Anesthetic complications: no Comments: Has been able to void.   No notable events documented.   Last Vitals:  Vitals:   05/19/24 0031 05/19/24 0256  BP: 125/68 107/66  Pulse: (!) 102 99  Resp: 18 18  Temp: 37.1 C 36.5 C  SpO2: 97% 98%    Last Pain:  Vitals:   05/19/24 0300  TempSrc:   PainSc: 0-No pain                 Krystyl Cannell Dyane

## 2024-05-19 NOTE — Lactation Note (Signed)
 This note was copied from a baby's chart. Lactation Consultation Note  Patient Name: Roberta Gonzales Unijb'd Date: 05/19/2024 Age:26 hours Reason for consult: Initial assessment   Maternal Data  This was an initial consultation with mom, dad, and baby, who is 83 hours old. Mom expressed comfort with latching, and denied any breast or nipple pain or discomfort. Mom described baby as having a good latch. Mom and dad denied any questions at this time.   Feeding Mother's Current Feeding Choice: Breast Milk  Lactation Tools Discussed/Used  The lactation student discussed hand expression with both mom and dad.  Interventions  Lactation student provided mom and dad with information related to I/O, hand expression to encourage baby to latch at the breast, and supply and demand, particularly to stimulate her breast by latching the baby at least eight times in a 24-hour period.  Consult Status Consult Status: Follow-up Follow-up type: In-patient    [facility] 05/19/2024, 12:33 PM

## 2024-05-20 MED ORDER — FERROUS SULFATE 325 (65 FE) MG PO TABS: 325.0000 mg | ORAL_TABLET | ORAL | Status: AC

## 2024-05-20 MED ORDER — IBUPROFEN 600 MG PO TABS: 600.0000 mg | ORAL_TABLET | Freq: Four times a day (QID) | ORAL | Status: AC | PRN

## 2024-05-20 MED ORDER — ACETAMINOPHEN 325 MG PO TABS: 650.0000 mg | ORAL_TABLET | Freq: Four times a day (QID) | ORAL | Status: AC | PRN

## 2024-05-20 NOTE — Progress Notes (Signed)
 Patient discharged home with family. Discharge instructions, when to follow up, and prescriptions reviewed with patient. Patient verbalized understanding. Patient will be escorted out by auxiliary.

## 2024-05-20 NOTE — Lactation Note (Signed)
 This note was copied from a baby's chart. Lactation Consultation Note  Patient Name: Boy Sandrine Bloodsworth Unijb'd Date: 05/20/2024 Age:26 hours Reason for consult: Follow-up assessment;Primapara;Other (Comment) (Discharge Education)   Maternal Data Follow up assessment and discharge education w/ 39hr old baby and parents.  Mom states that feedings have been good.  She didn't have any concerns at the present time.  Feeding Mother's Current Feeding Choice: Breast Milk  Interventions Interventions: Breast feeding basics reviewed;Education;CDC milk storage guidelines  Discharge Discharge Education: Engorgement and breast care;Warning signs for feeding baby;Outpatient recommendation  Education on engorgement prevention/treatment was discussed as well as breastmilk storage guidelines.  LC provided patient with a handout on breastmilk storage guidelines from [facility]. [facility] outpatient lactation services phone number written on the white board in the room.  Patient verbalized understanding.  LC also provided education from the postpartum book about warning signs to look for in a poor feeding.    Consult Status Consult Status: Complete Follow-up type: Call as needed    Keldan Eplin S Jamire Shabazz 05/20/2024, 11:48 AM

## 2024-06-09 ENCOUNTER — Ambulatory Visit: Admitting: Dermatology

## 2024-06-09 ENCOUNTER — Encounter: Payer: Self-pay | Admitting: Dermatology

## 2024-06-09 DIAGNOSIS — L578 Other skin changes due to chronic exposure to nonionizing radiation: Secondary | ICD-10-CM | POA: Diagnosis not present

## 2024-06-09 DIAGNOSIS — D2261 Melanocytic nevi of right upper limb, including shoulder: Secondary | ICD-10-CM

## 2024-06-09 DIAGNOSIS — Z808 Family history of malignant neoplasm of other organs or systems: Secondary | ICD-10-CM | POA: Diagnosis not present

## 2024-06-09 DIAGNOSIS — W908XXA Exposure to other nonionizing radiation, initial encounter: Secondary | ICD-10-CM | POA: Diagnosis not present

## 2024-06-09 DIAGNOSIS — L821 Other seborrheic keratosis: Secondary | ICD-10-CM

## 2024-06-09 DIAGNOSIS — D229 Melanocytic nevi, unspecified: Secondary | ICD-10-CM

## 2024-06-09 DIAGNOSIS — Z5111 Encounter for antineoplastic chemotherapy: Secondary | ICD-10-CM

## 2024-06-09 DIAGNOSIS — D235 Other benign neoplasm of skin of trunk: Secondary | ICD-10-CM | POA: Diagnosis not present

## 2024-06-09 DIAGNOSIS — D225 Melanocytic nevi of trunk: Secondary | ICD-10-CM | POA: Diagnosis not present

## 2024-06-09 DIAGNOSIS — Z85828 Personal history of other malignant neoplasm of skin: Secondary | ICD-10-CM | POA: Diagnosis not present

## 2024-06-09 DIAGNOSIS — L989 Disorder of the skin and subcutaneous tissue, unspecified: Secondary | ICD-10-CM

## 2024-06-09 DIAGNOSIS — Z7189 Other specified counseling: Secondary | ICD-10-CM

## 2024-06-09 DIAGNOSIS — L814 Other melanin hyperpigmentation: Secondary | ICD-10-CM

## 2024-06-09 DIAGNOSIS — D239 Other benign neoplasm of skin, unspecified: Secondary | ICD-10-CM

## 2024-06-09 DIAGNOSIS — Z79899 Other long term (current) drug therapy: Secondary | ICD-10-CM

## 2024-06-09 DIAGNOSIS — D489 Neoplasm of uncertain behavior, unspecified: Secondary | ICD-10-CM | POA: Diagnosis not present

## 2024-06-09 HISTORY — DX: Other benign neoplasm of skin, unspecified: D23.9

## 2024-06-09 NOTE — Progress Notes (Signed)
 "  Follow-Up Visit   Subjective  Roberta Gonzales is a 28 y.o. female who presents for the following:  patient is here today to have abnormal moles noted at last visit rechecked and to recheck a spot previous bx at proven BCC at right mid lateral pretibia.   Patient also would like to have a freckle at left forehead at hairline checked today   The following portions of the chart were reviewed this encounter and updated as appropriate: medications, allergies, medical history  Review of Systems:  No other skin or systemic complaints except as noted in HPI or Assessment and Plan.  Objective  Well appearing patient in no apparent distress; mood and affect are within normal limits.   A focused examination was performed of the following areas: Right pretibia, abdomen   Relevant exam findings are noted in the Assessment and Plan.  Nevus at left upper quadrant abdomen      Photos of scar right mid lateral pretibial  Photos of scar at right mid lateral pretibial    left upper quadrant abdomen proximal 6.5 cm superior and lateral to umbilicus 0.7 cm irregular brown macule   right dorsum wrist 0.6 cm irregular brown macule   Left Forehead at hairline 0.6 cm irregular brown macaule    Assessment & Plan   FAMILY HISTORY OF SKIN CANCER What type(s):melanoma Who affected:Mother  Recommend year cancer screening exams   HISTORY OF BASAL CELL CARCINOMA OF THE SKIN 05/12/2024 right mid lateral pretibial - Margins free-  today observed area - clear- would recommend to continue to watch or treat with -5-fluorouracil/calcipotriene cream to use bid for 14 days Patient prefers cream treatment  - Start 5-fluorouracil/calcipotriene cream twice a day for 14 days to affected areas including right mid lateral pretibial . Prescription sent to [facility]. Patient advised they will receive an email to purchase the medication online and have it sent to their home.  Patient provided with handout reviewing treatment course and side effects and advised to call or message us  on MyChart with any concerns. Reviewed course of treatment and expected reaction.  Patient advised to expect inflammation and crusting and advised that erosions are possible.  Patient advised to be diligent with sun protection during and after treatment. Counseled to keep medication out of reach of children and pets. - No evidence of recurrence today - Recommend regular full body skin exams - Recommend daily broad spectrum sunscreen SPF 30+ to sun-exposed areas, reapply every 2 hours as needed.  - Call if any new or changing lesions are noted between office visits  MELANOCYTIC NEVI Nevus left upper abdomen 0.6 cm regular brown papule new photo today Patient reports that she has had for many years with no change noticed. Will continue to watch.  Benign-appearing.  Observation.  Call clinic for new or changing lesions.  Recommend daily use of broad spectrum spf 30+ sunscreen to sun-exposed areas.    Nevus vs Lentigo at Left oral commissure  Present for many many years without change. Exam: see photos.  Regular dark brown macule Treatment Plan: No change for many years Benign-appearing.  Observation.  Call clinic for new or changing moles.  Recommend daily use of broad spectrum spf 30+ sunscreen to sun-exposed areas.  NEOPLASM OF UNCERTAIN BEHAVIOR (3) left upper quadrant abdomen proximal 6.5 cm superior and lateral to umbilicus - Epidermal / dermal shaving  Lesion diameter (cm):  0.7 Informed consent: discussed and consent obtained   Timeout: patient name, date  of birth, surgical site, and procedure verified   Procedure prep:  Patient was prepped and draped in usual sterile fashion Prep type:  Isopropyl alcohol Anesthesia: the lesion was anesthetized in a standard fashion   Anesthetic:  1% lidocaine  w/ epinephrine  1-100,000 buffered w/ 8.4% NaHCO3 Instrument used: flexible razor  blade   Hemostasis achieved with: pressure, aluminum chloride and electrodesiccation   Outcome: patient tolerated procedure well   Post-procedure details: sterile dressing applied and wound care instructions given   Dressing type: bandage and petrolatum    Specimen 1 - Surgical pathology Differential Diagnosis: irregular nevus r/o dysplasia   Check Margins: yes right dorsum wrist - Epidermal / dermal shaving  Lesion diameter (cm):  0.6 Informed consent: discussed and consent obtained   Timeout: patient name, date of birth, surgical site, and procedure verified   Procedure prep:  Patient was prepped and draped in usual sterile fashion Prep type:  Isopropyl alcohol Anesthesia: the lesion was anesthetized in a standard fashion   Anesthetic:  1% lidocaine  w/ epinephrine  1-100,000 buffered w/ 8.4% NaHCO3 Instrument used: flexible razor blade   Hemostasis achieved with: pressure, aluminum chloride and electrodesiccation   Outcome: patient tolerated procedure well   Post-procedure details: sterile dressing applied and wound care instructions given   Dressing type: bandage and petrolatum    Specimen 2 - Surgical pathology Differential Diagnosis: irregular nevus r/o dysplasia   Check Margins: yes Left Forehead at hairline - Epidermal / dermal shaving  Lesion diameter (cm):  0.6 Informed consent: discussed and consent obtained   Timeout: patient name, date of birth, surgical site, and procedure verified   Procedure prep:  Patient was prepped and draped in usual sterile fashion Prep type:  Isopropyl alcohol Anesthesia: the lesion was anesthetized in a standard fashion   Anesthetic:  1% lidocaine  w/ epinephrine  1-100,000 buffered w/ 8.4% NaHCO3 Instrument used: flexible razor blade   Hemostasis achieved with: pressure, aluminum chloride and electrodesiccation   Outcome: patient tolerated procedure well   Post-procedure details: sterile dressing applied and wound care instructions given    Dressing type: bandage and petrolatum    Specimen 3 - Surgical pathology Differential Diagnosis: irregular nevus r/o dysplasia   Check Margins: yes Irregular nevus r/o dysplasia   ACTINIC DAMAGE - chronic, secondary to cumulative UV radiation exposure/sun exposure over time - diffuse scaly erythematous macules with underlying dyspigmentation - Recommend daily broad spectrum sunscreen SPF 30+ to sun-exposed areas, reapply every 2 hours as needed.  - Recommend staying in the shade or wearing long sleeves, sun glasses (UVA+UVB protection) and wide brim hats (4-inch brim around the entire circumference of the hat). - Call for new or changing lesions.  LENTIGINES Exam: scattered tan macules Due to sun exposure Treatment Plan: Benign-appearing, observe. Recommend daily broad spectrum sunscreen SPF 30+ to sun-exposed areas, reapply every 2 hours as needed.  Call for any changes  HISTORY OF BASAL CELL CARCINOMA   ACTINIC SKIN DAMAGE   FAMILY HISTORY OF SKIN CANCER   CHEMOTHERAPY MANAGEMENT, ENCOUNTER FOR   MEDICATION MANAGEMENT   COUNSELING AND COORDINATION OF CARE   LENTIGO   MELANOCYTIC NEVUS, UNSPECIFIED LOCATION    Return for keep follow  up as scheduled.  IEleanor Blush, CMA, am acting as scribe for Alm Rhyme, MD.   Documentation: I have reviewed the above documentation for accuracy and completeness, and I agree with the above.  Alm Rhyme, MD    "

## 2024-06-09 NOTE — Patient Instructions (Addendum)
 - Start 5-fluorouracil/calcipotriene cream twice a day for 14 days to affected areas including previous treated spot at right lower leg. Prescription sent to [facility]. Patient advised they will receive an email to purchase the medication online and have it sent to their home. Patient provided with handout reviewing treatment course and side effects and advised to call or message us  on MyChart with any concerns.  Reviewed course of treatment and expected reaction.  Patient advised to expect inflammation and crusting and advised that erosions are possible.  Patient advised to be diligent with sun protection during and after treatment. Counseled to keep medication out of reach of children and pets.  Instructions for [facility]  One or more of your medications was sent to the [facility] pharmacy. You will receive an email from them and can purchase the medicine through that link. It will then be mailed to your home at the address you confirmed. If for any reason you do not receive an email from them, please check your spam folder. If you still do not find the email, please let us  know. [facility] phone number is [phone].    5-Fluorouracil/Calcipotriene Patient Education   Actinic keratoses are the dry, red scaly spots on the skin caused by sun damage. A portion of these spots can turn into skin cancer with time, and treating them can help prevent development of skin cancer.   Treatment of these spots requires removal of the defective skin cells. There are various ways to remove actinic keratoses, including freezing with liquid nitrogen, treatment with creams, or treatment with a blue light procedure in the office.   5-fluorouracil cream is a topical cream used to treat actinic keratoses. It works by interfering with the growth of abnormal fast-growing skin cells, such as actinic keratoses. These cells peel off and are  replaced by healthy ones.   5-fluorouracil/calcipotriene is a combination of the 5-fluorouracil cream with a vitamin D analog cream called calcipotriene. The calcipotriene alone does not treat actinic keratoses. However, when it is combined with 5-fluorouracil, it helps the 5-fluorouracil treat the actinic keratoses much faster so that the same results can be achieved with a much shorter treatment time.  INSTRUCTIONS FOR 5-FLUOROURACIL/CALCIPOTRIENE CREAM:   5-fluorouracil/calcipotriene cream typically only needs to be used for 4-7 days. A thin layer should be applied twice a day to the treatment areas recommended by your physician.   If your physician prescribed you separate tubes of 5-fluourouracil and calcipotriene, apply a thin layer of 5-fluorouracil followed by a thin layer of calcipotriene.   Avoid contact with your eyes, nostrils, and mouth. Do not use 5-fluorouracil/calcipotriene cream on infected or open wounds.   You will develop redness, irritation and some crusting at areas where you have pre-cancer damage/actinic keratoses. IF YOU DEVELOP PAIN, BLEEDING, OR SIGNIFICANT CRUSTING, STOP THE TREATMENT EARLY - you have already gotten a good response and the actinic keratoses should clear up well.  Wash your hands after applying 5-fluorouracil 5% cream on your skin.   A moisturizer or sunscreen with a minimum SPF 30 should be applied each morning.   Once you have finished the treatment, you can apply a thin layer of Vaseline twice a day to irritated areas to soothe and calm the areas more quickly. If you experience significant discomfort, contact your physician.  For some patients it is necessary to repeat the treatment for best results.  SIDE EFFECTS: When using 5-fluorouracil/calcipotriene cream, you may have mild  irritation, such as redness, dryness, swelling, or a mild burning sensation. This usually resolves within 2 weeks. The more actinic keratoses you have, the more redness and  inflammation you can expect during treatment. Eye irritation has been reported rarely. If this occurs, please let us  know.  If you have any trouble using this cream, please call the office. If you have any other questions about this information, please do not hesitate to ask me before you leave the office.  Due to recent changes in healthcare laws, you may see results of your pathology and/or laboratory studies on MyChart before the doctors have had a chance to review them. We understand that in some cases there may be results that are confusing or concerning to you. Please understand that not all results are received at the same time and often the doctors may need to interpret multiple results in order to provide you with the best plan of care or course of treatment. Therefore, we ask that you please give us  2 business days to thoroughly review all your results before contacting the office for clarification. Should we see a critical lab result, you will be contacted sooner.     Biopsy Wound Care Instructions  Leave the original bandage on for 24 hours if possible.  If the bandage becomes soaked or soiled before that time, it is OK to remove it and examine the wound.  A small amount of post-operative bleeding is normal.  If excessive bleeding occurs, remove the bandage, place gauze over the site and apply continuous pressure (no peeking) over the area for 30 minutes. If this does not work, please call our clinic as soon as possible or page your doctor if it is after hours.   Once a day, cleanse the wound with soap and water. It is fine to shower. If a thick crust develops you may use a Q-tip dipped into dilute hydrogen peroxide (mix 1:1 with water) to dissolve it.  Hydrogen peroxide can slow the healing process, so use it only as needed.    After washing, apply petroleum jelly (Vaseline) or an antibiotic ointment if your doctor prescribed one for you, followed by a bandage.    For best healing, the  wound should be covered with a layer of ointment at all times. If you are not able to keep the area covered with a bandage to hold the ointment in place, this may mean re-applying the ointment several times a day.  Continue this wound care until the wound has healed and is no longer open.   Itching and mild discomfort is normal during the healing process. However, if you develop pain or severe itching, please call our office.   If you have any discomfort, you can take Tylenol  (acetaminophen ) or ibuprofen  as directed on the bottle. (Please do not take these if you have an allergy to them or cannot take them for another reason).  Some redness, tenderness and white or yellow material in the wound is normal healing.  If the area becomes very sore and red, or develops a thick yellow-green material (pus), it may be infected; please notify us .    If you have stitches, return to clinic as directed to have the stitches removed. You will continue wound care for 2-3 days after the stitches are removed.   Wound healing continues for up to one year following surgery. It is not unusual to experience pain in the scar from time to time during the interval.  If the pain  becomes severe or the scar thickens, you should notify the office.    A slight amount of redness in a scar is expected for the first six months.  After six months, the redness will fade and the scar will soften and fade.  The color difference becomes less noticeable with time.  If there are any problems, return for a post-op surgery check at your earliest convenience.  To improve the appearance of the scar, you can use silicone scar gel, cream, or sheets (such as Mederma or Serica) every night for up to one year. These are available over the counter (without a prescription).  Please call our office at [phone] for any questions or concerns.    If You Need Anything After Your Visit  If you have any questions or concerns for your doctor,  please call our main line at [phone] and press option 4 to reach your doctor's medical assistant. If no one answers, please leave a voicemail as directed and we will return your call as soon as possible. Messages left after 4 pm will be answered the following business day.   You may also send us  a message via MyChart. We typically respond to MyChart messages within 1-2 business days.  For prescription refills, please ask your pharmacy to contact our office. Our fax number is [phone].  If you have an urgent issue when the clinic is closed that cannot wait until the next business day, you can page your doctor at the number below.    Please note that while we do our best to be available for urgent issues outside of office hours, we are not available 24/7.   If you have an urgent issue and are unable to reach us , you may choose to seek medical care at your doctor's office, retail clinic, urgent care center, or emergency room.  If you have a medical emergency, please immediately call 911 or go to the emergency department.  Pager Numbers  - Dr. Hester: [phone]  - Dr. Jackquline: [phone]  - Dr. Claudene: [phone]   - Dr. Raymund: [phone]  In the event of inclement weather, please call our main line at [phone] for an update on the status of any delays or closures.  Dermatology Medication Tips: Please keep the boxes that topical medications come in in order to help keep track of the instructions about where and how to use these. Pharmacies typically print the medication instructions only on the boxes and not directly on the medication tubes.   If your medication is too expensive, please contact our office at [phone] option 4 or send us  a message through MyChart.   We are unable to tell what your co-pay for medications will be in advance as this is different depending on your insurance coverage. However, we may be able to find a substitute medication at lower  cost or fill out paperwork to get insurance to cover a needed medication.   If a prior authorization is required to get your medication covered by your insurance company, please allow us  1-2 business days to complete this process.  Drug prices often vary depending on where the prescription is filled and some pharmacies may offer cheaper prices.  The website www.goodrx.com contains coupons for medications through different pharmacies. The prices here do not account for what the cost may be with help from insurance (it may be cheaper with your insurance), but the website can give you the price if you did not use any insurance.  - You  can print the associated coupon and take it with your prescription to the pharmacy.  - You may also stop by our office during regular business hours and pick up a GoodRx coupon card.  - If you need your prescription sent electronically to a different pharmacy, notify our office through [facility] or by phone at [phone] option 4.     Si Usted Necesita Algo Despus de Su Visita  Tambin puede enviarnos un mensaje a travs de Clinical Cytogeneticist. Por lo general respondemos a los mensajes de MyChart en el transcurso de 1 a 2 das hbiles.  Para renovar recetas, por favor pida a su farmacia que se ponga en contacto con nuestra oficina. Randi lakes de fax es [place] [phone].  Si tiene un asunto urgente cuando la clnica est cerrada y que no puede esperar hasta el siguiente da hbil, puede llamar/localizar a su doctor(a) al nmero que aparece a continuacin.   Por favor, tenga en cuenta que aunque hacemos todo lo posible para estar disponibles para asuntos urgentes fuera del horario de [place], no estamos disponibles las 24 horas del da, los 7 [address]  [address] de la [place].   Si tiene un problema urgente y no puede comunicarse con nosotros, puede optar por buscar atencin mdica  en el consultorio de su doctor(a), en una clnica privada, en un centro de atencin urgente o en  una sala de emergencias.  Si tiene engineer, drilling, por favor llame inmediatamente al 911 o vaya a la sala de emergencias.  Nmeros de bper  - Dr. Hester: [phone]  - Dra. Jackquline: [phone]  - Dr. Claudene: [phone]  - Dra. Kitts: [phone]  En caso de inclemencias del [place], por favor llame a nuestra lnea principal al [phone] para una actualizacin sobre el estado de cualquier retraso o cierre.  Consejos para la medicacin en dermatologa: Por favor, guarde las cajas en las que vienen los medicamentos de uso tpico para ayudarle a seguir las instrucciones sobre dnde y cmo usarlos. Las farmacias generalmente imprimen las instrucciones del medicamento slo en las cajas y no directamente en los tubos del [place].   Si su medicamento es muy caro, por favor, pngase en contacto con landry rieger llamando al [phone] y presione la opcin 4 o envenos un mensaje a travs de Clinical Cytogeneticist.   No podemos decirle cul ser su copago por los medicamentos por adelantado ya que esto es diferente dependiendo de la cobertura de su seguro. Sin embargo, es posible que podamos encontrar un medicamento sustituto a audiological scientist un formulario para que el seguro cubra el medicamento que se considera necesario.   Si se requiere una autorizacin previa para que su compaa de seguros cubra su medicamento, por favor permtanos de 1 a 2 das hbiles para completar este proceso.  Los precios de los medicamentos varan con frecuencia dependiendo del environmental consultant de dnde se surte la receta y alguna farmacias pueden ofrecer precios ms baratos.  El sitio web www.goodrx.com tiene cupones para medicamentos de health and safety inspector. Los precios aqu no tienen en cuenta lo que podra costar con la ayuda del seguro (puede ser ms barato con su seguro), pero el sitio web puede darle el precio si no utiliz tourist information centre manager.  - Puede imprimir el cupn correspondiente y llevarlo con su receta a  la farmacia.  - Tambin puede pasar por nuestra oficina durante el horario de atencin regular y education officer, museum una tarjeta de cupones de GoodRx.  - Si necesita que su receta se enve electrnicamente a  una farmacia diferente, informe a nuestra oficina a travs de MyChart de [place] o por telfono llamando al [phone] y presione la opcin 4.

## 2024-06-15 ENCOUNTER — Ambulatory Visit: Payer: Self-pay | Admitting: Dermatology

## 2024-06-15 LAB — SURGICAL PATHOLOGY

## 2024-06-16 ENCOUNTER — Encounter: Payer: Self-pay | Admitting: Dermatology

## 2024-06-16 NOTE — Telephone Encounter (Addendum)
 Tried calling patient regarding bx results. No answer. Lm for patient to return call. ----- Message from Alm Rhyme, MD sent at 06/15/2024  5:58 PM EST ----- FINAL DIAGNOSIS        1. Skin, left upper quadrant abdomen proximal 6.5 cm superior and lateral to umbilicus :       DYSPLASTIC NEVUS WITH MODERATE TO SEVERE ATYPIA, PERIPHERAL MARGIN INVOLVED, SEE       DESCRIPTION        2. Skin, right dorsum wrist :       DYSPLASTIC COMPOUND NEVUS WITH MODERATE ATYPIA, DEEP MARGIN INVOLVED        3. Skin, left forehead at hairline :       PIGMENTED SEBORRHEIC KERATOSIS    1- Moderate to Severe Dysplastic Schedule for surgery 2- Moderate Dysplastic Recheck next visit 3- Benign Keratosis No further treatment needed

## 2024-06-16 NOTE — Telephone Encounter (Signed)
 Patient made aware of BX results. She would like to talk to her mother about the results and who to schedule surgery with and then call back. aw

## 2024-06-16 NOTE — Telephone Encounter (Signed)
 Patient returned phone call, attempted to reach patient but unsuccessful. Left message for patient to call back.

## 2024-07-21 ENCOUNTER — Ambulatory Visit: Admitting: Dermatology

## 2024-10-19 ENCOUNTER — Ambulatory Visit: Admitting: Dermatology

## 2025-05-03 ENCOUNTER — Ambulatory Visit: Admitting: Dermatology
